# Patient Record
Sex: Male | Born: 1990 | Race: White | Hispanic: No | Marital: Single | Smoking: Never smoker
Health system: Southern US, Community
[De-identification: ages and names within clinical notes are randomized; demographics above are authoritative.]

## PROBLEM LIST (undated history)

## (undated) DIAGNOSIS — J45909 Unspecified asthma, uncomplicated: Secondary | ICD-10-CM

## (undated) DIAGNOSIS — R000 Tachycardia, unspecified: Secondary | ICD-10-CM

## (undated) DIAGNOSIS — R079 Chest pain, unspecified: Secondary | ICD-10-CM

## (undated) DIAGNOSIS — G90A Postural orthostatic tachycardia syndrome (POTS): Secondary | ICD-10-CM

## (undated) DIAGNOSIS — I951 Orthostatic hypotension: Secondary | ICD-10-CM

## (undated) DIAGNOSIS — R55 Syncope and collapse: Secondary | ICD-10-CM

## (undated) DIAGNOSIS — R002 Palpitations: Secondary | ICD-10-CM

## (undated) DIAGNOSIS — K219 Gastro-esophageal reflux disease without esophagitis: Secondary | ICD-10-CM

## (undated) DIAGNOSIS — I498 Other specified cardiac arrhythmias: Secondary | ICD-10-CM

## (undated) HISTORY — DX: Postural orthostatic tachycardia syndrome (POTS): G90.A

## (undated) HISTORY — DX: Orthostatic hypotension: I95.1

## (undated) HISTORY — DX: Palpitations: R00.2

## (undated) HISTORY — DX: Chest pain, unspecified: R07.9

## (undated) HISTORY — DX: Syncope and collapse: R55

## (undated) HISTORY — DX: Other specified cardiac arrhythmias: I49.8

## (undated) HISTORY — DX: Unspecified asthma, uncomplicated: J45.909

## (undated) HISTORY — DX: Tachycardia, unspecified: R00.0

---

## 2011-03-26 HISTORY — PX: OTHER SURGICAL HISTORY: SHX169

## 2012-09-29 DIAGNOSIS — R079 Chest pain, unspecified: Secondary | ICD-10-CM

## 2012-09-29 HISTORY — DX: Chest pain, unspecified: R07.9

## 2013-01-19 ENCOUNTER — Encounter: Payer: Self-pay | Admitting: Cardiovascular Disease

## 2013-01-19 ENCOUNTER — Encounter: Payer: Self-pay | Admitting: *Deleted

## 2013-01-19 DIAGNOSIS — J45909 Unspecified asthma, uncomplicated: Secondary | ICD-10-CM | POA: Insufficient documentation

## 2013-01-19 DIAGNOSIS — R002 Palpitations: Secondary | ICD-10-CM | POA: Insufficient documentation

## 2013-01-19 DIAGNOSIS — R079 Chest pain, unspecified: Secondary | ICD-10-CM | POA: Insufficient documentation

## 2013-01-19 DIAGNOSIS — R000 Tachycardia, unspecified: Secondary | ICD-10-CM | POA: Insufficient documentation

## 2013-01-20 ENCOUNTER — Ambulatory Visit (INDEPENDENT_AMBULATORY_CARE_PROVIDER_SITE_OTHER): Payer: BC Managed Care – PPO | Admitting: Cardiovascular Disease

## 2013-01-20 ENCOUNTER — Encounter: Payer: Self-pay | Admitting: *Deleted

## 2013-01-20 ENCOUNTER — Encounter: Payer: Self-pay | Admitting: Cardiovascular Disease

## 2013-01-20 VITALS — BP 118/74 | HR 44 | Ht 72.0 in | Wt 191.0 lb

## 2013-01-20 DIAGNOSIS — R000 Tachycardia, unspecified: Secondary | ICD-10-CM

## 2013-01-20 DIAGNOSIS — R079 Chest pain, unspecified: Secondary | ICD-10-CM

## 2013-01-20 NOTE — Assessment & Plan Note (Addendum)
Not clear to me that he has POTS.  He is not tachycardic and no change in BP or HR with abrubt standing.  Continue low dose beta blocker Will have him see Dr Graciela Husbands to see if any other w/u needed  Reviewed event monitor from [facility] SR/ST with no overtly high HR;s for activity level

## 2013-01-20 NOTE — Patient Instructions (Addendum)
Your physician recommends that you schedule a follow-up appointment in:  NEXT AVAILABLE  WITH DR Graciela Husbands   FOR POTS Your physician recommends that you continue on your current medications as directed. Please refer to the Current Medication list given to you today.  Your physician has requested that you have a stress echocardiogram. For further information please visit [email or web address] Please follow instruction sheet as given.

## 2013-01-20 NOTE — Assessment & Plan Note (Signed)
Atypical but ongoing for months.  F/U stress echo

## 2013-01-20 NOTE — Progress Notes (Signed)
Patient ID: Aldous Housel, male   DOB: 1990/12/16, 22 y.o.   MRN: [id number] 22 yo Thought he was seeing EP doctor today.  Long standing history of ? POTS.  Seen by pediatric cardiologist as kid age 79. Has mild dyspnea. Palpitations and atypical chest pain Reviewed notes from Dr [facility]  [place].  Changed atenolol to bisoprolol  Patient had some fatigue and reduced to 2.5 mg.  Chest pain is fleeting all day Non exertional.  No associated diaphoresis. Syncope No positional or pleuritic component Denies panic attacks.  Does not complain of dizzyness at this time.  No postural lightheadedness.  Denies drugs or excess ETOH.  Has not had stress test despite multiple visits to cardiologist with chest pain.  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  All other systems reviewed and negative   General: Affect appropriate Healthy:  appears stated age HEENT: normal Neck supple with no adenopathy JVP normal no bruits no thyromegaly Lungs clear with no wheezing and good diaphragmatic motion Heart:  S1/S2 no murmur,rub, gallop or click PMI normal Abdomen: benighn, BS positve, no tenderness, no AAA no bruit.  No HSM or HJR Distal pulses intact with no bruits No edema Neuro non-focal Skin warm and dry No muscular weakness  Medications Current Outpatient Prescriptions  Medication Sig Dispense Refill  . bisoprolol (ZEBETA) 5 MG tablet Take 5 mg by mouth daily. Take half of tab       No current facility-administered medications for this visit.    Allergies Neosporin  Family History: No family history on file.  Social History: History   Social History  . Marital Status: Married    Spouse Name: N/A    Number of Children: N/A  . Years  of Education: N/A   Occupational History  . Not on file.   Social History Main Topics  . Smoking status: Never Smoker   . Smokeless tobacco: Not on file  . Alcohol Use: Yes     Comment: occasional  . Drug Use: No  . Sexual Activity: Not on file   Other Topics Concern  . Not on file   Social History Narrative  . No narrative on file    Electrocardiogram: July 8 SR rate 61 nornmal   Assessment and Plan

## 2013-02-08 ENCOUNTER — Other Ambulatory Visit: Payer: Self-pay | Admitting: *Deleted

## 2013-02-08 DIAGNOSIS — R079 Chest pain, unspecified: Secondary | ICD-10-CM

## 2013-02-16 ENCOUNTER — Other Ambulatory Visit (HOSPITAL_COMMUNITY): Payer: BC Managed Care – PPO

## 2013-03-08 ENCOUNTER — Encounter: Payer: BC Managed Care – PPO | Admitting: Cardiovascular Disease

## 2013-03-08 DIAGNOSIS — R079 Chest pain, unspecified: Secondary | ICD-10-CM

## 2013-03-09 ENCOUNTER — Ambulatory Visit (INDEPENDENT_AMBULATORY_CARE_PROVIDER_SITE_OTHER): Payer: BC Managed Care – PPO | Admitting: Internal Medicine

## 2013-03-09 ENCOUNTER — Institutional Professional Consult (permissible substitution): Payer: BC Managed Care – PPO | Admitting: Internal Medicine

## 2013-03-09 ENCOUNTER — Encounter: Payer: Self-pay | Admitting: Internal Medicine

## 2013-03-09 ENCOUNTER — Encounter: Payer: Self-pay | Admitting: Cardiology

## 2013-03-09 ENCOUNTER — Ambulatory Visit (HOSPITAL_COMMUNITY): Payer: BC Managed Care – PPO | Attending: Cardiology | Admitting: Radiology

## 2013-03-09 VITALS — BP 138/84 | HR 75 | Ht 72.0 in | Wt 198.0 lb

## 2013-03-09 DIAGNOSIS — R072 Precordial pain: Secondary | ICD-10-CM

## 2013-03-09 DIAGNOSIS — R0609 Other forms of dyspnea: Secondary | ICD-10-CM | POA: Insufficient documentation

## 2013-03-09 DIAGNOSIS — R079 Chest pain, unspecified: Secondary | ICD-10-CM

## 2013-03-09 DIAGNOSIS — R0989 Other specified symptoms and signs involving the circulatory and respiratory systems: Secondary | ICD-10-CM | POA: Insufficient documentation

## 2013-03-09 DIAGNOSIS — R000 Tachycardia, unspecified: Secondary | ICD-10-CM

## 2013-03-09 NOTE — Progress Notes (Signed)
Echocardiogram performed.  

## 2013-03-09 NOTE — Assessment & Plan Note (Signed)
He has atypical chest pain and most certianly not cardiac related  i suspect this is Gi and anxiety  The evaluation evaluation is not demonstrating any specific pathology. Indeed, his vital signs and his history are also not consistent with a diagnosis of POTS although there may be some lack of sensitivity given his ongoing since therapy with bisoprolol. I suggested that he stop his bisoprolol and then may have recurrent discomfort to sequentially expose himself to the bisoprolol and omeprazole to see which one of the peppers; I suspect it will be the latter

## 2013-03-09 NOTE — Patient Instructions (Signed)
We will see you as needed  Your physician recommends that you continue on your current medications as directed. Please refer to the Current Medication list given to you today.  

## 2013-03-09 NOTE — Progress Notes (Signed)
ELECTROPHYSIOLOGY CONSULT NOTE  Patient ID: Jeffrey Lucas, MRN: [id number], DOB/AGE: [birth year] 22 y.o. Admit date: (Not on file) Date of Consult: 03/09/2013  Primary Physician: No primary provider on file. Primary Cardiologist:      Chief Complaint: ?POTS   HPI Jeffrey Lucas is a 22 y.o. male  There is a diagnosis of POTS with symptoms of chest pain and exercise intolerance he was then seen in various cardiology practices from the state.  Review of medical records are available suggested there was a normal echocardiogram and event recorder this is a rapid rates associated with non-exertion consistent with sinus tachycardia  Diagnosis may have POTS when he was 22 years old. He was treated with atenolol for some period of time. his symptoms gradually abated and he was then symptom free until the spring of this year except for one brief period in high school. At that point and again this spring he developed chest discomfort. He described it as pins and pressure in his chest. It lasted for 2 weeks  Plus and was 24/7 and was worse with recumbency and improved with sitting up. At that point he was started on atenolol which was stopped because of depressive symptoms and fatigue. He was put on the bisoprolol. Concurrent with that he was started on omeprazole  And there was large improvement       Past Medical History  Diagnosis Date  . Chest pain 09/29/12    Much of his exertional symptoms & chest pressure episodes are very common with the autonomic dysfunction syndromes. Holter (09/29/12-10/28/12) revealed some spells of elevated atrial rate w/o physical activity that would be suggestive additionally.   . Tachycardia   . Palpitations   . Asthma   . Postural orthostatic tachycardia syndrome     "Possible"  . Vasovagal episode     When giving blood  . Mild asthma       Surgical History: No past surgical history on file.   Home Meds: Prior to Admission medications   Medication Sig  Start Date End Date Taking? Authorizing Provider  bisoprolol (ZEBETA) 5 MG tablet Take 5 mg by mouth daily. Take half of tab   Yes Historical Provider, MD      Allergies:  Allergies  Allergen Reactions  . Neosporin [Neomycin-Bacitracin Zn-Polymyx]     History   Social History  . Marital Status: Single    Spouse Name: N/A    Number of Children: N/A  . Years of Education: N/A   Occupational History  . Not on file.   Social History Main Topics  . Smoking status: Never Smoker   . Smokeless tobacco: Not on file  . Alcohol Use: Yes     Comment: occasional  . Drug Use: No  . Sexual Activity: Not on file   Other Topics Concern  . Not on file   Social History Narrative  . No narrative on file     No family history on file.   ROS:  Please see the history of present illness.   Anxiety   All other systems reviewed and negative.    Physical Exam:  Blood pressure 138/84, pulse 75, height 6' (1.829 m), weight 198 lb (89.812 kg). General: Well developed, well nourished male in no acute distress. Head: Normocephalic, atraumatic, sclera non-icteric, no xanthomas, nares are without discharge. EENT: normal Lymph Nodes:  none Back: without scoliosis/kyphosis  , no CVA tendersness Neck: Negative for carotid bruits. JVD not elevated. Lungs: Clear bilaterally to auscultation without  wheezes, rales, or rhonchi. Breathing is unlabored. Heart: RRR with S1 S2. No  systolic murmur , rubs, or gallops appreciated. Abdomen: Soft, non-tender, non-distended with normoactive bowel sounds. No hepatomegaly. No rebound/guarding. No obvious abdominal masses. Msk:  Strength and tone appear normal for age. Extremities: No clubbing or cyanosis. No  edema.  Distal pedal pulses are 2+ and equal bilaterally. Skin: Warm and Dry Neuro: Alert and oriented X 3. CN III-XII intact Grossly normal sensory and motor function . Psych:  Responds to questions appropriately with a normal affect.       Labs: Cardiac Enzymes No results found for this basename: CKTOTAL, CKMB, TROPONINI,  in the last 72 hours CBC No results found for this basename: WBC, HGB, HCT, MCV, PLT   PROTIME: No results found for this basename: LABPROT, INR,  in the last 72 hours Chemistry No results found for this basename: NA, K, CL, CO2, BUN, CREATININE, CALCIUM, LABALBU, PROT, BILITOT, ALKPHOS, ALT, AST, GLUCOSE,  in the last 168 hours Lipids No results found for this basename: CHOL, HDL, LDLCALC, TRIG   BNP No results found for this basename: probnp   Miscellaneous No results found for this basename: DDIMER    Radiology/Studies:  No results found.  EKG: *SNR  Assessment and Plan:    Sherryl Manges

## 2016-03-28 ENCOUNTER — Ambulatory Visit (INDEPENDENT_AMBULATORY_CARE_PROVIDER_SITE_OTHER): Payer: Self-pay | Admitting: Nurse Practitioner

## 2016-03-28 ENCOUNTER — Encounter: Payer: Self-pay | Admitting: Nurse Practitioner

## 2016-03-28 VITALS — BP 138/72 | HR 92 | Temp 99.2°F | Resp 20 | Ht 72.0 in | Wt 184.0 lb

## 2016-03-28 DIAGNOSIS — K2960 Other gastritis without bleeding: Secondary | ICD-10-CM

## 2016-03-28 MED ORDER — OMEPRAZOLE 20 MG PO CPDR: 20.0000 mg | DELAYED_RELEASE_CAPSULE | Freq: Every day | ORAL | 2 refills | Status: AC

## 2016-03-28 NOTE — Patient Instructions (Addendum)
Gastroesophageal Reflux Disease, Adult Normally, food travels down the esophagus and stays in the stomach to be digested. However, when a person has gastroesophageal reflux disease (GERD), food and stomach acid move back up into the esophagus. When this happens, the esophagus becomes sore and inflamed. Over time, GERD can create small holes (ulcers) in the lining of the esophagus. What are the causes? This condition is caused by a problem with the muscle between the esophagus and the stomach (lower esophageal sphincter, or LES). Normally, the LES muscle closes after food passes through the esophagus to the stomach. When the LES is weakened or abnormal, it does not close properly, and that allows food and stomach acid to go back up into the esophagus. The LES can be weakened by certain dietary substances, medicines, and medical conditions, including:  Tobacco use.  Pregnancy.  Having a hiatal hernia.  Heavy alcohol use.  Certain foods and beverages, such as coffee, chocolate, onions, and peppermint.  What increases the risk? This condition is more likely to develop in:  People who have an increased body weight.  People who have connective tissue disorders.  People who use NSAID medicines.  What are the signs or symptoms? Symptoms of this condition include:  Heartburn.  Difficult or painful swallowing.  The feeling of having a lump in the throat.  Abitter taste in the mouth.  Bad breath.  Having a large amount of saliva.  Having an upset or bloated stomach.  Belching.  Chest pain.  Shortness of breath or wheezing.  Ongoing (chronic) cough or a night-time cough.  Wearing away of tooth enamel.  Weight loss.  Different conditions can cause chest pain. Make sure to see your health care provider if you experience chest pain. How is this diagnosed? Your health care provider will take a medical history and perform a physical exam. To determine if you have mild or severe  GERD, your health care provider may also monitor how you respond to treatment. You may also have other tests, including:  An endoscopy toexamine your stomach and esophagus with a small camera.  A test thatmeasures the acidity level in your esophagus.  A test thatmeasures how much pressure is on your esophagus.  A barium swallow or modified barium swallow to show the shape, size, and functioning of your esophagus.  How is this treated? The goal of treatment is to help relieve your symptoms and to prevent complications. Treatment for this condition may vary depending on how severe your symptoms are. Your health care provider may recommend:  Changes to your diet.  Medicine.  Surgery.  Follow these instructions at home: Diet  Follow a diet as recommended by your health care provider. This may involve avoiding foods and drinks such as: ? Coffee and tea (with or without caffeine). ? Drinks that containalcohol. ? Energy drinks and sports drinks. ? Carbonated drinks or sodas. ? Chocolate and cocoa. ? Peppermint and mint flavorings. ? Garlic and onions. ? Horseradish. ? Spicy and acidic foods, including peppers, chili powder, curry powder, vinegar, hot sauces, and barbecue sauce. ? Citrus fruit juices and citrus fruits, such as oranges, lemons, and limes. ? Tomato-based foods, such as red sauce, chili, salsa, and pizza with red sauce. ? Fried and fatty foods, such as donuts, french fries, potato chips, and high-fat dressings. ? High-fat meats, such as hot dogs and fatty cuts of red and white meats, such as rib eye steak, sausage, ham, and bacon. ? High-fat dairy items, such as whole milk,   butter, and cream cheese.  Eat small, frequent meals instead of large meals.  Avoid drinking large amounts of liquid with your meals.  Avoid eating meals during the 2-3 hours before bedtime.  Avoid lying down right after you eat.  Do not exercise right after you eat. General  instructions  Pay attention to any changes in your symptoms.  Take over-the-counter and prescription medicines only as told by your health care provider. Do not take aspirin, ibuprofen, or other NSAIDs unless your health care provider told you to do so.  Do not use any tobacco products, including cigarettes, chewing tobacco, and e-cigarettes. If you need help quitting, ask your health care provider.  Wear loose-fitting clothing. Do not wear anything tight around your waist that causes pressure on your abdomen.  Raise (elevate) the head of your bed 6 inches (15cm).  Try to reduce your stress, such as with yoga or meditation. If you need help reducing stress, ask your health care provider.  If you are overweight, reduce your weight to an amount that is healthy for you. Ask your health care provider for guidance about a safe weight loss goal.  Keep all follow-up visits as told by your health care provider. This is important. Contact a health care provider if:  You have new symptoms.  You have unexplained weight loss.  You have difficulty swallowing, or it hurts to swallow.  You have wheezing or a persistent cough.  Your symptoms do not improve with treatment.  You have a hoarse voice. Get help right away if:  You have pain in your arms, neck, jaw, teeth, or back.  You feel sweaty, dizzy, or light-headed.  You have chest pain or shortness of breath.  You vomit and your vomit looks like blood or coffee grounds.  You faint.  Your stool is bloody or black.  You cannot swallow, drink, or eat. This information is not intended to replace advice given to you by your health care provider. Make sure you discuss any questions you have with your health care provider. Document Released: 12/19/2004 Document Revised: 08/09/2015 Document Reviewed: 07/06/2014 Elsevier Interactive Patient Education  2017 [employer]  

## 2016-03-28 NOTE — Progress Notes (Signed)
   Subjective:    Patient ID: Jeffrey Lucas, male    DOB: 1991-02-17, 26 y.o.   MRN: [id number]  The patient is a 26 y.o. Male that presents with intermittent vomiting x 2 months but worse over the past week.  The patient states he has a wave of nausea in the morning and then vomits one episode in the mornings.  The patient states it looks like "spit" and is bile colored.  The patient states he does have a feeling of "stomach tightening" before the episode happens.  The patient denies changes in his diet, but admits to eating late at night right before bed.   The patient further denies fever, weakness, chills, diarrhea, constipation, change in bowel habits or bladder habits, dysuria, frequency or urgency.  C/o sore throat "feeling irritated", intermittent cough and intermittent SOB. Patient is a smoker. Patient does admit that he was diagnosed with POTs in the past and was prescribed bisoprolol but has since stopped taking it. Patient has not been taking any medications for his symptoms.  Patient states his father has a history of acid reflux.      Review of Systems  Constitutional: Negative.  Negative for activity change, appetite change and fever.  HENT: Negative.   Respiratory: Shortness of breath: intermittent.   Cardiovascular: Negative.   Gastrointestinal: Positive for abdominal pain (describes as abdominal tightness) and vomiting (episodic, occurring only in the mornings). Negative for constipation, diarrhea and nausea.  Genitourinary: Negative.   Allergic/Immunologic: Positive for environmental allergies. Negative for immunocompromised state.  Neurological: Positive for headaches.  Psychiatric/Behavioral: Negative.        Objective:   Physical Exam  Constitutional: He is oriented to person, place, and time. He appears well-developed and well-nourished. No distress.  HENT:  Head: Normocephalic and atraumatic.  Neck: Normal range of motion. Neck supple.  Cardiovascular: Normal  rate, regular rhythm and normal heart sounds.   Pulmonary/Chest: Effort normal and breath sounds normal. No respiratory distress. He has no wheezes.  Abdominal: Soft. Bowel sounds are normal. He exhibits no distension and no mass. There is tenderness (LUQ tenderness). There is no rebound and no guarding.  Neurological: He is alert and oriented to person, place, and time. He has normal reflexes. No cranial nerve deficit.  Skin: Skin is warm and dry.  Psychiatric: He has a normal mood and affect. His behavior is normal. Judgment and thought content normal.  Vitals reviewed.         Assessment & Plan:  Bile Acid Reflux.  Patient given prescription for Prilosec.  Patient to return in 1-2 days for follow up for LUQ tenderness.  Patient to use warm liquids or honey for throat discomfort.  Patient informed that smoking can worsen reflux symptoms.  Reviewed indications for patient to go to ER, fever, severe abdominal pain, increased vomiting, diarrhea or any other concerns.  Patient verbalizes understanding.

## 2016-07-09 ENCOUNTER — Ambulatory Visit: Payer: Self-pay | Admitting: Surgery

## 2016-07-22 NOTE — Patient Instructions (Addendum)
Alix Stowers  07/22/2016   Your procedure is scheduled on: 08-01-16  Report to [facility] Main  Entrance Take [facility]  elevators to 3rd floor to  Short Stay Center at 750  AM.    Call this number if you have problems the morning of surgery [phone]   Remember: ONLY 1 PERSON MAY GO WITH YOU TO SHORT STAY TO GET  READY MORNING OF YOUR SURGERY.  Do not eat food or drink liquids :After Midnight.     Take these medicines the morning of surgery with A SIP OF WATER: DEXILANT, FLONASE, CLARITIN, PROAIR INHALER IF NEEDED AND BRING INHALER, LORATADINE              You may not have any metal on your body including hair pins and              piercings  Do not wear jewelry, make-up, lotions, powders or perfumes, deodorant             Do not wear nail polish.  Do not shave  48 hours prior to surgery.              Men may shave face and neck.   Do not bring valuables to the hospital. [facility] IS NOT             RESPONSIBLE   FOR VALUABLES.  Contacts, dentures or bridgework may not be worn into surgery.  Leave suitcase in the car. After surgery it may be brought to your room.                  Please read over the following fact sheets you were given: _____________________________________________________________________             [facility] - Preparing for Surgery Before surgery, you can play an important role.  Because skin is not sterile, your skin needs to be as free of germs as possible.  You can reduce the number of germs on your skin by washing with CHG (chlorahexidine gluconate) soap before surgery.  CHG is an antiseptic cleaner which kills germs and bonds with the skin to continue killing germs even after washing. Please DO NOT use if you have an allergy to CHG or antibacterial soaps.  If your skin becomes reddened/irritated stop using the CHG and inform your nurse when you arrive at Short Stay. Do not shave (including legs and underarms) for at least 48  hours prior to the first CHG shower.  You may shave your face/neck. Please follow these instructions carefully:  1.  Shower with CHG Soap the night before surgery and the  morning of Surgery.  2.  If you choose to wash your hair, wash your hair first as usual with your  normal  shampoo.  3.  After you shampoo, rinse your hair and body thoroughly to remove the  shampoo.                           4.  Use CHG as you would any other liquid soap.  You can apply chg directly  to the skin and wash                       Gently with a scrungie or clean washcloth.  5.  Apply the CHG Soap to your body ONLY FROM THE  NECK DOWN.   Do not use on face/ open                           Wound or open sores. Avoid contact with eyes, ears mouth and genitals (private parts).                       Wash face,  Genitals (private parts) with your normal soap.             6.  Wash thoroughly, paying special attention to the area where your surgery  will be performed.  7.  Thoroughly rinse your body with warm water from the neck down.  8.  DO NOT shower/wash with your normal soap after using and rinsing off  the CHG Soap.                9.  Pat yourself dry with a clean towel.            10.  Wear clean pajamas.            11.  Place clean sheets on your bed the night of your first shower and do not  sleep with pets. Day of Surgery : Do not apply any lotions/deodorants the morning of surgery.  Please wear clean clothes to the hospital/surgery center.  FAILURE TO FOLLOW THESE INSTRUCTIONS MAY RESULT IN THE CANCELLATION OF YOUR SURGERY PATIENT SIGNATURE_________________________________  NURSE SIGNATURE__________________________________  ________________________________________________________________________

## 2016-07-23 ENCOUNTER — Encounter (HOSPITAL_COMMUNITY): Payer: Self-pay

## 2016-07-23 ENCOUNTER — Encounter (HOSPITAL_COMMUNITY)
Admission: RE | Admit: 2016-07-23 | Discharge: 2016-07-23 | Disposition: A | Discharge status: Home / Self Care | Payer: BLUE CROSS/BLUE SHIELD | Source: Ambulatory Visit | Attending: Surgery | Admitting: Surgery

## 2016-07-23 DIAGNOSIS — Z01810 Encounter for preprocedural cardiovascular examination: Secondary | ICD-10-CM | POA: Diagnosis not present

## 2016-07-23 DIAGNOSIS — K824 Cholesterolosis of gallbladder: Secondary | ICD-10-CM | POA: Diagnosis not present

## 2016-07-23 DIAGNOSIS — Z01812 Encounter for preprocedural laboratory examination: Secondary | ICD-10-CM | POA: Insufficient documentation

## 2016-07-23 DIAGNOSIS — I10 Essential (primary) hypertension: Secondary | ICD-10-CM | POA: Insufficient documentation

## 2016-07-23 DIAGNOSIS — Z01818 Encounter for other preprocedural examination: Secondary | ICD-10-CM | POA: Diagnosis present

## 2016-07-23 HISTORY — DX: Gastro-esophageal reflux disease without esophagitis: K21.9

## 2016-07-23 LAB — BASIC METABOLIC PANEL
ANION GAP: 6 (ref 5–15)
BUN: 8 mg/dL (ref 6–20)
CALCIUM: 9.5 mg/dL (ref 8.9–10.3)
CO2: 26 mmol/L (ref 22–32)
Chloride: 108 mmol/L (ref 101–111)
Creatinine, Ser: 0.78 mg/dL (ref 0.61–1.24)
GFR calc Af Amer: >60 mL/min (ref >60)
GLUCOSE: 93 mg/dL (ref 65–99)
Potassium: 4.4 mmol/L (ref 3.5–5.1)
Sodium: 140 mmol/L (ref 135–145)

## 2016-07-23 LAB — CBC
HCT: 46.1 % (ref 39.0–52.0)
HEMOGLOBIN: 15.3 g/dL (ref 13.0–17.0)
MCH: 28.2 pg (ref 26.0–34.0)
MCHC: 33.2 g/dL (ref 30.0–36.0)
MCV: 84.9 fL (ref 78.0–100.0)
Platelets: 193 10*3/uL (ref 150–400)
RBC: 5.43 MIL/uL (ref 4.22–5.81)
RDW: 12.4 % (ref 11.5–15.5)
WBC: 6.3 10*3/uL (ref 4.0–10.5)

## 2016-07-24 NOTE — Progress Notes (Signed)
Final EKG done 07/23/16-epic

## 2016-07-30 ENCOUNTER — Encounter (HOSPITAL_COMMUNITY): Payer: Self-pay | Admitting: Surgery

## 2016-07-30 DIAGNOSIS — K8010 Calculus of gallbladder with chronic cholecystitis without obstruction: Secondary | ICD-10-CM | POA: Diagnosis present

## 2016-07-30 DIAGNOSIS — K824 Cholesterolosis of gallbladder: Secondary | ICD-10-CM | POA: Diagnosis present

## 2016-07-30 NOTE — H&P (Signed)
General Surgery [facility]   Jeffrey CurlingDaniel T Lucas 07/09/2016 2:52 PM Location: [facility] Patient #: [id number] DOB: [birth year] Single / Language: [place] / Race: Refused to Report/Unreported Male   History of Present Illness Jeffrey Lucas([name] [name] MD; 07/09/2016 3:23 PM) The patient is a 26 year old male who presents for evaluation of gall stones. CHIEF COMPLAINT: Gallstones, gallbladder polyp  Patient is referred by Marva PandaKimberly Millsaps, NP, for evaluation of symptomatic cholelithiasis and gallbladder polyp. Patient had experienced intermittent episodes of nausea and vomiting associated with epigastric and right upper quadrant abdominal pain throughout 2017. These symptoms occurred once or twice a month. Beginning in January 2018 the patient experienced abdominal discomfort and nausea and vomiting nearly every morning following breakfast. Patient was seen and evaluated at urgent care and started on proton pump inhibitor and medication for nausea. He had persistent symptoms. He underwent abdominal ultrasound at [facility] on June 26, 2016. This demonstrated a 6 mm gallbladder polyp as well as mobile echogenic foci within the gallbladder consistent with gallstones. No other significant abnormality was seen on abdominal ultrasound.  Patient has a family history of gallbladder disease in his mother. He has had no prior abdominal surgery. He denies jaundice or acholic stools. He does have sweats and some chills during attacks of nausea and vomiting. Patient has no history of pancreatic or hepatobiliary disease. Current medications include Dexilant, loratadine, and Zofran.   Diagnostic Studies History Jeffrey Lucas([name], LPN; 0/86/57844/17/2018 6:962:55 PM) Colonoscopy  never  Allergies Jeffrey Lucas([name], LPN; 2/95/28414/17/2018 3:242:53 PM) Neosporin AF *DERMATOLOGICALS*  Allergies Reconciled   Medication History Jeffrey Lucas([name], LPN; 4/01/02724/17/2018 5:362:54 PM) Fluticasone Propionate (50MCG/ACT  Suspension, Nasal) Active. Dexilant (60MG  Capsule DR, Oral) Active. Loratadine (10MG  Capsule, Oral) Active. Zofran (8MG  Tablet, Oral) Active. Medications Reconciled  Social History Jeffrey Lucas([name], LPN; 6/44/03474/17/2018 4:252:55 PM) Alcohol use  Remotely quit alcohol use. Caffeine use  Carbonated beverages. Illicit drug use  Uses daily. Tobacco use  Former smoker.  Family History Jeffrey Lucas([name], LPN; 9/56/38754/17/2018 6:432:55 PM) Heart Disease  Mother. Hypertension  Mother.  Other Problems Jeffrey Lucas([name], LPN; 3/29/51884/17/2018 4:162:55 PM) Cholelithiasis  Gastroesophageal Reflux Disease  Other disease, cancer, significant illness     Review of Systems Jeffrey Lucas([name] LPN; 6/06/30164/17/2018 0:102:55 PM) General Present- Appetite Loss. Not Present- Chills, Fatigue, Fever, Night Sweats, Weight Gain and Weight Loss. Skin Not Present- Change in Wart/Mole, Dryness, Hives, Jaundice, New Lesions, Non-Healing Wounds, Rash and Ulcer. HEENT Present- Wears glasses/contact lenses. Not Present- Earache, Hearing Loss, Hoarseness, Nose Bleed, Oral Ulcers, Ringing in the Ears, Seasonal Allergies, Sinus Pain, Sore Throat, Visual Disturbances and Yellow Eyes. Respiratory Not Present- Bloody sputum, Chronic Cough, Difficulty Breathing, Snoring and Wheezing. Breast Not Present- Breast Mass, Breast Pain, Nipple Discharge and Skin Changes. Cardiovascular Not Present- Chest Pain, Difficulty Breathing Lying Down, Leg Cramps, Palpitations, Rapid Heart Rate, Shortness of Breath and Swelling of Extremities. Gastrointestinal Present- Constipation and Vomiting. Not Present- Abdominal Pain, Bloating, Bloody Stool, Change in Bowel Habits, Chronic diarrhea, Difficulty Swallowing, Excessive gas, Gets full quickly at meals, Hemorrhoids, Indigestion, Nausea and Rectal Pain. Male Genitourinary Not Present- Blood in Urine, Change in Urinary Stream, Frequency, Impotence, Nocturia, Painful Urination, Urgency and Urine Leakage. Musculoskeletal Not Present-  Back Pain, Joint Pain, Joint Stiffness, Muscle Pain, Muscle Weakness and Swelling of Extremities. Neurological Not Present- Decreased Memory, Fainting, Headaches, Numbness, Seizures, Tingling, Tremor, Trouble walking and Weakness. Psychiatric Not Present- Anxiety, Bipolar, Change in Sleep Pattern, Depression, Fearful and Frequent crying. Endocrine Not Present- Cold  Intolerance, Excessive Hunger, Hair Changes, Heat Intolerance, Hot flashes and New Diabetes. Hematology Not Present- Blood Thinners, Easy Bruising, Excessive bleeding, Gland problems, HIV and Persistent Infections.  Vitals Jeffrey Lucas Dockery LPN; 1/61/0960 4:54 PM) 07/09/2016 2:52 PM Weight: 174 lb Height: 72in Body Surface Area: 2.01 m Body Mass Index: 23.6 kg/m  Temp.: 98.42F(Oral)  Pulse: 79 (Regular)  BP: 112/60 (Sitting, Left Arm, Standard)       Physical Exam Jeffrey Heckler MD; 07/09/2016 3:23 PM) The physical exam findings are as follows: Note:Constitutional: See vital signs recorded above  General appearance: normal development, nutritional status normal, no deformities  Eyes: conjunctiva and lids normal without lesion; pupils equal and reactive; iris normal bilaterally  Ears, nose, mouth, throat: external exam without lesion or mass; hearing grossly normal; lips normal; teeth normal for age; gums without obvious disease; oral mucosa moist, tongue normal  Neck: symmetric on extension; trachea midline; no crepitance; no masses  Thyroid: normal in size, no palpable nodules, no tenderness  Chest - clear bilaterally without rhonchi, rales, or wheeze  Cor - regular rhythm with normal rate; no significant murmur  Abd - soft without distension; no surgical incisions; no sign of hernia; mild tenderness to palpation in the epigastrium and right upper quadrant without mass; no hepatosplenomegaly  Ext - non-tender without significant edema or lymphedema  Neuro - grossly intact; no tremor    Assessment &  Plan Jeffrey Heckler MD; 07/09/2016 3:26 PM) CHOLELITHIASES (K80.20) GALLBLADDER POLYP (K82.4) Current Plans Pt Education - Pamphlet Given - Laparoscopic Gallbladder Surgery: discussed with patient and provided information. Patient presents on referral for evaluation of symptomatic cholelithiasis, gastroesophageal reflux, and gallbladder polyp. Patient is provided with written literature on gallbladder disease to review at home.  After review of his ultrasound and a discussion of his clinical history, I have recommended proceeding with laparoscopic cholecystectomy with intraoperative cholangiography. We discussed alternatively seeking gastrointestinal consultation and further studies to include upper endoscopy or radiographic studies. However, these may be unnecessary if his symptoms improved dramatically with cholecystectomy. Given the patient's young age, the presence of gallstones and a gallbladder polyp at this point in time likely commit him to having cholecystectomy at some point in the future. Also cholecystectomy may significantly improve his symptoms of reflux.  Patient would like to proceed with surgery. We discussed the potential for conversion to open surgery. We discussed the hospital stay to be anticipated. We discussed his activity level and return to activity and work following the procedure. He understands and wishes to proceed in the near future.  The risks and benefits of the procedure have been discussed at length with the patient. The patient understands the proposed procedure, potential alternative treatments, and the course of recovery to be expected. All of the patient's questions have been answered at this time. The patient wishes to proceed with surgery.  Jeffrey Heckler, MD, [facility] Office: [phone]

## 2016-08-01 ENCOUNTER — Observation Stay (HOSPITAL_COMMUNITY)
Admission: RE | Admit: 2016-08-01 | Discharge: 2016-08-01 | Disposition: A | Discharge status: Home / Self Care | Payer: BLUE CROSS/BLUE SHIELD | Source: Ambulatory Visit | Attending: Surgery | Admitting: Surgery

## 2016-08-01 ENCOUNTER — Ambulatory Visit (HOSPITAL_COMMUNITY): Payer: BLUE CROSS/BLUE SHIELD | Admitting: Anesthesiology

## 2016-08-01 ENCOUNTER — Ambulatory Visit (HOSPITAL_COMMUNITY): Payer: BLUE CROSS/BLUE SHIELD

## 2016-08-01 ENCOUNTER — Encounter (HOSPITAL_COMMUNITY)
Admission: RE | Disposition: A | Discharge status: Home / Self Care | Payer: Self-pay | Source: Ambulatory Visit | Attending: Surgery

## 2016-08-01 ENCOUNTER — Encounter (HOSPITAL_COMMUNITY): Payer: Self-pay | Admitting: *Deleted

## 2016-08-01 DIAGNOSIS — K8010 Calculus of gallbladder with chronic cholecystitis without obstruction: Principal | ICD-10-CM | POA: Diagnosis present

## 2016-08-01 DIAGNOSIS — Z8249 Family history of ischemic heart disease and other diseases of the circulatory system: Secondary | ICD-10-CM | POA: Insufficient documentation

## 2016-08-01 DIAGNOSIS — Z79899 Other long term (current) drug therapy: Secondary | ICD-10-CM | POA: Insufficient documentation

## 2016-08-01 DIAGNOSIS — Z888 Allergy status to other drugs, medicaments and biological substances status: Secondary | ICD-10-CM | POA: Insufficient documentation

## 2016-08-01 DIAGNOSIS — Z881 Allergy status to other antibiotic agents status: Secondary | ICD-10-CM | POA: Insufficient documentation

## 2016-08-01 DIAGNOSIS — K5900 Constipation, unspecified: Secondary | ICD-10-CM | POA: Diagnosis not present

## 2016-08-01 DIAGNOSIS — Z8379 Family history of other diseases of the digestive system: Secondary | ICD-10-CM | POA: Diagnosis not present

## 2016-08-01 DIAGNOSIS — K219 Gastro-esophageal reflux disease without esophagitis: Secondary | ICD-10-CM | POA: Diagnosis not present

## 2016-08-01 DIAGNOSIS — Z87891 Personal history of nicotine dependence: Secondary | ICD-10-CM | POA: Diagnosis not present

## 2016-08-01 DIAGNOSIS — K824 Cholesterolosis of gallbladder: Secondary | ICD-10-CM | POA: Diagnosis present

## 2016-08-01 DIAGNOSIS — K8020 Calculus of gallbladder without cholecystitis without obstruction: Secondary | ICD-10-CM

## 2016-08-01 HISTORY — PX: CHOLECYSTECTOMY: SHX55

## 2016-08-01 SURGERY — LAPAROSCOPIC CHOLECYSTECTOMY WITH INTRAOPERATIVE CHOLANGIOGRAM
Anesthesia: General | Site: Abdomen

## 2016-08-01 MED ORDER — HYDROCODONE-ACETAMINOPHEN 5-325 MG PO TABS
1.0000 | ORAL_TABLET | ORAL | Status: DC | PRN
  Administered 2016-08-01 (×2): 1 via ORAL
  Administered 2016-08-01: 18:00:00 2 via ORAL
  Filled 2016-08-01: qty 1
  Filled 2016-08-01: qty 2
  Filled 2016-08-01: qty 1

## 2016-08-01 MED ORDER — FENTANYL CITRATE (PF) 100 MCG/2ML IJ SOLN
INTRAMUSCULAR | Status: AC
  Filled 2016-08-01: qty 2

## 2016-08-01 MED ORDER — SUGAMMADEX SODIUM 200 MG/2ML IV SOLN
INTRAVENOUS | Status: DC | PRN
  Administered 2016-08-01: 150 mg via INTRAVENOUS

## 2016-08-01 MED ORDER — ACETAMINOPHEN 650 MG RE SUPP: 650.0000 mg | Freq: Four times a day (QID) | RECTAL | Status: DC | PRN

## 2016-08-01 MED ORDER — HYDROCODONE-ACETAMINOPHEN 5-325 MG PO TABS: 1.0000 | ORAL_TABLET | ORAL | 0 refills | Status: DC | PRN

## 2016-08-01 MED ORDER — FENTANYL CITRATE (PF) 100 MCG/2ML IJ SOLN
25.0000 ug | INTRAMUSCULAR | Status: DC | PRN
  Administered 2016-08-01 (×2): 25 ug via INTRAVENOUS
  Administered 2016-08-01: 50 ug via INTRAVENOUS

## 2016-08-01 MED ORDER — KETOROLAC TROMETHAMINE 30 MG/ML IJ SOLN
INTRAMUSCULAR | Status: AC
  Filled 2016-08-01: qty 1

## 2016-08-01 MED ORDER — KETOROLAC TROMETHAMINE 30 MG/ML IJ SOLN
30.0000 mg | Freq: Once | INTRAMUSCULAR | Status: AC
  Administered 2016-08-01: 30 mg via INTRAVENOUS

## 2016-08-01 MED ORDER — ROCURONIUM BROMIDE 50 MG/5ML IV SOSY
PREFILLED_SYRINGE | INTRAVENOUS | Status: DC | PRN
  Administered 2016-08-01: 35 mg via INTRAVENOUS

## 2016-08-01 MED ORDER — PROPOFOL 10 MG/ML IV BOLUS
INTRAVENOUS | Status: AC
  Filled 2016-08-01: qty 20

## 2016-08-01 MED ORDER — ACETAMINOPHEN 325 MG PO TABS: 650.0000 mg | ORAL_TABLET | Freq: Four times a day (QID) | ORAL | Status: DC | PRN

## 2016-08-01 MED ORDER — IOPAMIDOL (ISOVUE-300) INJECTION 61%
INTRAVENOUS | Status: DC | PRN
  Administered 2016-08-01: 6 mL

## 2016-08-01 MED ORDER — ONDANSETRON HCL 4 MG/2ML IJ SOLN: 4.0000 mg | Freq: Once | INTRAMUSCULAR | Status: DC | PRN

## 2016-08-01 MED ORDER — ONDANSETRON HCL 4 MG/2ML IJ SOLN
INTRAMUSCULAR | Status: DC | PRN
  Administered 2016-08-01: 4 mg via INTRAVENOUS

## 2016-08-01 MED ORDER — KCL IN DEXTROSE-NACL 20-5-0.45 MEQ/L-%-% IV SOLN
INTRAVENOUS | Status: DC
  Administered 2016-08-01: 13:00:00 via INTRAVENOUS
  Filled 2016-08-01: qty 1000

## 2016-08-01 MED ORDER — ACETAMINOPHEN 10 MG/ML IV SOLN
1000.0000 mg | Freq: Once | INTRAVENOUS | Status: AC
  Administered 2016-08-01: 1000 mg via INTRAVENOUS

## 2016-08-01 MED ORDER — OXYCODONE HCL 5 MG PO TABS: 5.0000 mg | ORAL_TABLET | Freq: Once | ORAL | Status: DC | PRN

## 2016-08-01 MED ORDER — LACTATED RINGERS IR SOLN
Status: DC | PRN
  Administered 2016-08-01: 1000 mL

## 2016-08-01 MED ORDER — FLUTICASONE PROPIONATE 50 MCG/ACT NA SUSP
1.0000 | Freq: Every day | NASAL | Status: DC
  Filled 2016-08-01: qty 16

## 2016-08-01 MED ORDER — FENTANYL CITRATE (PF) 100 MCG/2ML IJ SOLN
INTRAMUSCULAR | Status: DC | PRN
  Administered 2016-08-01: 100 ug via INTRAVENOUS
  Administered 2016-08-01 (×3): 50 ug via INTRAVENOUS

## 2016-08-01 MED ORDER — MIDAZOLAM HCL 5 MG/5ML IJ SOLN
INTRAMUSCULAR | Status: DC | PRN
  Administered 2016-08-01: 2 mg via INTRAVENOUS

## 2016-08-01 MED ORDER — FENTANYL CITRATE (PF) 250 MCG/5ML IJ SOLN
INTRAMUSCULAR | Status: AC
  Filled 2016-08-01: qty 5

## 2016-08-01 MED ORDER — ONDANSETRON HCL 4 MG/2ML IJ SOLN
INTRAMUSCULAR | Status: AC
  Filled 2016-08-01: qty 2

## 2016-08-01 MED ORDER — LIDOCAINE 2% (20 MG/ML) 5 ML SYRINGE
INTRAMUSCULAR | Status: DC | PRN
  Administered 2016-08-01: 100 mg via INTRAVENOUS

## 2016-08-01 MED ORDER — OXYCODONE HCL 5 MG/5ML PO SOLN
5.0000 mg | Freq: Once | ORAL | Status: DC | PRN
  Filled 2016-08-01: qty 5

## 2016-08-01 MED ORDER — ONDANSETRON 4 MG PO TBDP: 4.0000 mg | ORAL_TABLET | Freq: Four times a day (QID) | ORAL | Status: DC | PRN

## 2016-08-01 MED ORDER — CEFAZOLIN SODIUM-DEXTROSE 2-4 GM/100ML-% IV SOLN
2.0000 g | INTRAVENOUS | Status: AC
  Administered 2016-08-01: 2 g via INTRAVENOUS
  Filled 2016-08-01: qty 100

## 2016-08-01 MED ORDER — CHLORHEXIDINE GLUCONATE CLOTH 2 % EX PADS: 6.0000 | MEDICATED_PAD | Freq: Once | CUTANEOUS | Status: DC

## 2016-08-01 MED ORDER — SUCCINYLCHOLINE CHLORIDE 200 MG/10ML IV SOSY
PREFILLED_SYRINGE | INTRAVENOUS | Status: DC | PRN
  Administered 2016-08-01: 120 mg via INTRAVENOUS

## 2016-08-01 MED ORDER — FENTANYL CITRATE (PF) 100 MCG/2ML IJ SOLN
INTRAMUSCULAR | Status: AC
  Administered 2016-08-01: 25 ug via INTRAVENOUS
  Filled 2016-08-01: qty 2

## 2016-08-01 MED ORDER — ACETAMINOPHEN 10 MG/ML IV SOLN
INTRAVENOUS | Status: AC
  Filled 2016-08-01: qty 100

## 2016-08-01 MED ORDER — LACTATED RINGERS IV SOLN
INTRAVENOUS | Status: DC
  Administered 2016-08-01: 08:00:00 via INTRAVENOUS
  Administered 2016-08-01: 1000 mL via INTRAVENOUS

## 2016-08-01 MED ORDER — ROCURONIUM BROMIDE 50 MG/5ML IV SOSY
PREFILLED_SYRINGE | INTRAVENOUS | Status: AC
  Filled 2016-08-01: qty 5

## 2016-08-01 MED ORDER — ONDANSETRON HCL 4 MG/2ML IJ SOLN: 4.0000 mg | Freq: Four times a day (QID) | INTRAMUSCULAR | Status: DC | PRN

## 2016-08-01 MED ORDER — SUCCINYLCHOLINE CHLORIDE 200 MG/10ML IV SOSY
PREFILLED_SYRINGE | INTRAVENOUS | Status: AC
  Filled 2016-08-01: qty 10

## 2016-08-01 MED ORDER — IOPAMIDOL (ISOVUE-300) INJECTION 61%
INTRAVENOUS | Status: AC
  Filled 2016-08-01: qty 50

## 2016-08-01 MED ORDER — HYDROMORPHONE HCL 1 MG/ML IJ SOLN: 1.0000 mg | INTRAMUSCULAR | Status: DC | PRN

## 2016-08-01 MED ORDER — ALBUTEROL SULFATE (2.5 MG/3ML) 0.083% IN NEBU: 3.0000 mL | INHALATION_SOLUTION | Freq: Four times a day (QID) | RESPIRATORY_TRACT | Status: DC | PRN

## 2016-08-01 MED ORDER — FENTANYL CITRATE (PF) 100 MCG/2ML IJ SOLN
25.0000 ug | INTRAMUSCULAR | Status: DC | PRN
  Administered 2016-08-01 (×2): 25 ug via INTRAVENOUS

## 2016-08-01 MED ORDER — DEXAMETHASONE SODIUM PHOSPHATE 10 MG/ML IJ SOLN
INTRAMUSCULAR | Status: DC | PRN
  Administered 2016-08-01: 10 mg via INTRAVENOUS

## 2016-08-01 MED ORDER — PROPOFOL 10 MG/ML IV BOLUS
INTRAVENOUS | Status: DC | PRN
  Administered 2016-08-01: 200 mg via INTRAVENOUS

## 2016-08-01 MED ORDER — SUGAMMADEX SODIUM 200 MG/2ML IV SOLN
INTRAVENOUS | Status: AC
  Filled 2016-08-01: qty 2

## 2016-08-01 MED ORDER — BUPIVACAINE HCL (PF) 0.25 % IJ SOLN
INTRAMUSCULAR | Status: AC
  Filled 2016-08-01: qty 30

## 2016-08-01 MED ORDER — 0.9 % SODIUM CHLORIDE (POUR BTL) OPTIME
TOPICAL | Status: DC | PRN
  Administered 2016-08-01: 1000 mL

## 2016-08-01 MED ORDER — DEXAMETHASONE SODIUM PHOSPHATE 10 MG/ML IJ SOLN
INTRAMUSCULAR | Status: AC
  Filled 2016-08-01: qty 1

## 2016-08-01 MED ORDER — LIDOCAINE 2% (20 MG/ML) 5 ML SYRINGE
INTRAMUSCULAR | Status: AC
  Filled 2016-08-01: qty 5

## 2016-08-01 MED ORDER — MIDAZOLAM HCL 2 MG/2ML IJ SOLN
INTRAMUSCULAR | Status: AC
  Filled 2016-08-01: qty 2

## 2016-08-01 MED ORDER — BUPIVACAINE HCL (PF) 0.25 % IJ SOLN
INTRAMUSCULAR | Status: DC | PRN
  Administered 2016-08-01: 20 mL

## 2016-08-01 MED ORDER — FENTANYL CITRATE (PF) 100 MCG/2ML IJ SOLN: 25.0000 ug | INTRAMUSCULAR | Status: DC | PRN

## 2016-08-01 SURGICAL SUPPLY — 32 items
APPLIER CLIP ROT 10 11.4 M/L (STAPLE) ×3
CABLE HIGH FREQUENCY MONO STRZ (ELECTRODE) ×3 IMPLANT
CHLORAPREP W/TINT 26ML (MISCELLANEOUS) ×3 IMPLANT
CLIP APPLIE ROT 10 11.4 M/L (STAPLE) ×1 IMPLANT
CLOSURE WOUND 1/2 X4 (GAUZE/BANDAGES/DRESSINGS) ×1
COVER MAYO STAND STRL (DRAPES) ×3 IMPLANT
COVER SURGICAL LIGHT HANDLE (MISCELLANEOUS) ×3 IMPLANT
DECANTER SPIKE VIAL GLASS SM (MISCELLANEOUS) IMPLANT
DRAPE C-ARM 42X120 X-RAY (DRAPES) ×3 IMPLANT
ELECT REM PT RETURN 15FT ADLT (MISCELLANEOUS) ×3 IMPLANT
GAUZE SPONGE 2X2 8PLY STRL LF (GAUZE/BANDAGES/DRESSINGS) ×1 IMPLANT
GAUZE SPONGE 4X4 12PLY STRL (GAUZE/BANDAGES/DRESSINGS) ×3 IMPLANT
GLOVE SURG ORTHO 8.0 STRL STRW (GLOVE) ×3 IMPLANT
GOWN STRL REUS W/TWL XL LVL3 (GOWN DISPOSABLE) ×9 IMPLANT
HEMOSTAT SURGICEL 4X8 (HEMOSTASIS) IMPLANT
KIT BASIN OR (CUSTOM PROCEDURE TRAY) ×3 IMPLANT
POUCH SPECIMEN RETRIEVAL 10MM (ENDOMECHANICALS) ×3 IMPLANT
SCISSORS LAP 5X35 DISP (ENDOMECHANICALS) ×3 IMPLANT
SET CHOLANGIOGRAPH MIX (MISCELLANEOUS) ×3 IMPLANT
SET IRRIG TUBING LAPAROSCOPIC (IRRIGATION / IRRIGATOR) ×3 IMPLANT
SLEEVE XCEL OPT CAN 5 100 (ENDOMECHANICALS) ×3 IMPLANT
SPONGE GAUZE 2X2 STER 10/PKG (GAUZE/BANDAGES/DRESSINGS) ×2
STRIP CLOSURE SKIN 1/2X4 (GAUZE/BANDAGES/DRESSINGS) ×2 IMPLANT
SUT MNCRL AB 4-0 PS2 18 (SUTURE) ×3 IMPLANT
TAPE CLOTH SURG 4X10 WHT LF (GAUZE/BANDAGES/DRESSINGS) ×3 IMPLANT
TOWEL OR 17X26 10 PK STRL BLUE (TOWEL DISPOSABLE) ×3 IMPLANT
TOWEL OR NON WOVEN STRL DISP B (DISPOSABLE) ×3 IMPLANT
TRAY LAPAROSCOPIC (CUSTOM PROCEDURE TRAY) ×3 IMPLANT
TROCAR BLADELESS OPT 5 100 (ENDOMECHANICALS) ×3 IMPLANT
TROCAR XCEL BLUNT TIP 100MML (ENDOMECHANICALS) ×3 IMPLANT
TROCAR XCEL NON-BLD 11X100MML (ENDOMECHANICALS) ×3 IMPLANT
TUBING INSUF HEATED (TUBING) IMPLANT

## 2016-08-01 NOTE — Discharge Instructions (Signed)
°  [facility] ° °LAPAROSCOPIC SURGERY:  POST-OP INSTRUCTIONS ° °Always review your discharge instruction sheet given to you by the facility where your surgery was performed. ° °A prescription for pain medication may be given to you upon discharge.  Take your pain medication as prescribed.  If narcotic pain medicine is not needed, then you may take acetaminophen (Tylenol) or ibuprofen (Advil) as needed. ° °Take your usually prescribed medications unless otherwise directed. ° °If you need a refill on your pain medication, please contact your pharmacy.  They will contact our office to request authorization. Prescriptions will not be filled after 5 P.M. or on weekends. ° °You should follow a light diet the first few days after arrival home, such as soup and crackers or toast.  Be sure to include plenty of fluids daily. ° °Most patients will experience some swelling and bruising in the area of the incisions.  Ice packs will help.  Swelling and bruising can take several days to resolve.  ° °It is common to experience some constipation after surgery.  Increasing fluid intake and taking a stool softener (such as Colace) will usually help or prevent this problem from occurring.  A mild laxative (Milk of Magnesia or Miralax) should be taken according to package instructions if there has been no bowel movement after 48 hours. ° °You will have steri-strips and a gauze dressing over your incisions.  You may remove the gauze bandage on the second day after surgery, and you may shower at that time.  Leave your steri-strips (small skin tapes) in place directly over the incision.  These strips should remain on the skin for 5-7 days and then be removed.  You may get them wet in the shower and pat them dry. ° °Any sutures or staples will be removed at the office during your follow-up visit. ° °ACTIVITIES:  You may resume regular (light) daily activities beginning the next day - such as daily self-care, walking,  climbing stairs - gradually increasing activities as tolerated.  You may have sexual intercourse when it is comfortable.  Refrain from any heavy lifting or straining until approved by your doctor. ° °You may drive when you are no longer taking prescription pain medication, you can comfortably wear a seatbelt, and you can safely maneuver your car and apply brakes. ° °You should see your doctor in the office for a follow-up appointment approximately 2-3 weeks after your surgery.  Make sure that you call for this appointment within a day or two after you arrive home to insure a convenient appointment time. ° °WHEN TO CALL YOUR DOCTOR: °1. Fever over 101.0 °2. Inability to urinate °3. Continued bleeding from incision °4. Increased pain, redness, or drainage from the incision °5. Increasing abdominal pain ° °The clinic staff is available to answer your questions during regular business hours.  Please don’t hesitate to call and ask to speak to one of the nurses for clinical concerns.  If you have a medical emergency, go to the nearest emergency room or call 911.  A surgeon from [facility] is always on call for the hospital. ° °Jaquavius Hudler M. Kaylea Mounsey, MD, FACS °[facility] °Office: [phone] °Toll Free:  [phone] °FAX [phone] ° °Website: [email or web address] °

## 2016-08-01 NOTE — Transfer of Care (Signed)
Immediate Anesthesia Transfer of Care Note  Patient: Jeffrey Lucas  Procedure(s) Performed: Procedure(s): LAPAROSCOPIC CHOLECYSTECTOMY WITH INTRAOPERATIVE CHOLANGIOGRAM (N/A)  Patient Location: PACU  Anesthesia Type:General  Level of Consciousness:  sedated, patient cooperative and responds to stimulation  Airway & Oxygen Therapy:Patient Spontanous Breathing and Patient connected to face mask oxgen  Post-op Assessment:  Report given to PACU RN and Post -op Vital signs reviewed and stable  Post vital signs:  Reviewed and stable  Last Vitals:  Vitals:   08/01/16 0755  BP: 138/85  Pulse: 95  Resp: 18  Temp: 36.8 C    Complications: No apparent anesthesia complications

## 2016-08-01 NOTE — Op Note (Signed)
Procedure Note  Pre-operative Diagnosis:  Chronic cholecystitis, cholelithiasis, gallbladder polyp  Post-operative Diagnosis:  same  Surgeon:  Velora Heckler, MD, FACS  Assistant:  none   Procedure:  Laparoscopic cholecystectomy with intra-operative cholangiography  Anesthesia:  General  Estimated Blood Loss:  minimal  Drains: none         Specimen: Gallbladder to pathology  Indications:  Patient is referred by [name], NP, for evaluation of symptomatic cholelithiasis and gallbladder polyp. Patient had experienced intermittent episodes of nausea and vomiting associated with epigastric and right upper quadrant abdominal pain throughout 2017. These symptoms occurred once or twice a month. Beginning in January 2018 the patient experienced abdominal discomfort and nausea and vomiting nearly every morning following breakfast. Patient was seen and evaluated at urgent care and started on proton pump inhibitor and medication for nausea. He had persistent symptoms. He underwent abdominal ultrasound at [facility] on June 26, 2016. This demonstrated a 6 mm gallbladder polyp as well as mobile echogenic foci within the gallbladder consistent with gallstones. No other significant abnormality was seen on abdominal ultrasound.  Procedure Details:  The patient was seen in the pre-op holding area. The risks, benefits, complications, treatment options, and expected outcomes were previously discussed with the patient. The patient agreed with the proposed plan and has signed the informed consent form.  The patient was brought to the Operating Room, identified as Jeffrey Lucas and the procedure verified as laparoscopic cholecystectomy with intraoperative cholangiography. A "time out" was completed and the above information confirmed.  The patient was placed in the supine position. Following induction of general anesthesia, the abdomen was prepped and draped in the usual aseptic  fashion.  An incision was made in the skin near the umbilicus. The midline fascia was incised and the peritoneal cavity was entered and a Hasson canula was introduced under direct vision.  The Hasson canula was secured with a 0-Vicryl pursestring suture. Pneumoperitoneum was established with carbon dioxide. Additional trocars were introduced under direct vision along the right costal margin in the midline, mid-clavicular line, and anterior axillary line.   The gallbladder was identified and the fundus grasped and retracted cephalad. Adhesions were taken down bluntly and the electrocautery was utilized as needed, taking care not to injure any adjacent structures. The infundibulum was grasped and retracted laterally, exposing the peritoneum overlying the triangle of Calot. The peritoneum was incised and structures exposed with blunt dissection. The cystic duct was clearly identified, bluntly dissected circumferentially, and clipped at the neck of the gallbladder.  An incision was made in the cystic duct and the cholangiogram catheter introduced. The catheter was secured using an ligaclip.  Real-time cholangiography was performed using C-arm fluoroscopy.  There was rapid filling of a normal caliber common bile duct.  There was reflux of contrast into the left and right hepatic ductal systems.  There was free flow distally into the duodenum without filling defect or obstruction.  The catheter was removed from the peritoneal cavity.  The cystic duct was then ligated with surgical clips and divided. The cystic artery was identified, dissected circumferentially, ligated with ligaclips, and divided.  The gallbladder was dissected away from the liver bed using the electrocautery for hemostasis. The gallbladder was completely removed from the liver and placed into an endocatch bag. The gallbladder was removed in the endocatch bag through the umbilical port site and submitted to pathology for review.  The right  upper quadrant was irrigated and the gallbladder bed was inspected. Hemostasis was achieved with  the electrocautery.  Pneumoperitoneum was released after viewing removal of the trocars with good hemostasis noted. The umbilical wound was irrigated and the fascia was then closed with the pursestring suture.  Local anesthetic was infiltrated at all port sites. The skin incisions were closed with 4-0 Monocril subcuticular sutures and steri-strips and dressings were applied.  Instrument, sponge, and needle counts were correct at the conclusion of the case.  The patient was awakened from anesthesia and brought to the recovery room in stable condition.  The patient tolerated the procedure well.   Velora Hecklerodd M. Kenith Trickel, MD, [facility] Office: [phone]

## 2016-08-01 NOTE — Discharge Summary (Signed)
Physician Discharge Summary [facility]  Patient ID: Jeffrey Lucas MRN: [id number] DOB/AGE: 1990/08/22 25 y.o.  Admit date: 08/01/2016 Discharge date: 08/01/2016  Admission Diagnoses:  Chronic cholecystitis, cholelithiasis, gallbladder polyp  Discharge Diagnoses:  Principal Problem:   Cholelithiasis with chronic cholecystitis Active Problems:   Gallbladder polyp   Discharged Condition: good  Hospital Course: Patient was admitted for observation following gallbladder surgery.  Post op course was uncomplicated.  Pain was well controlled.  Tolerated diet.  Voiding.  Patient was prepared for discharge home on POD#0.  Consults: None  Treatments: surgery: lap chole with IOC  Discharge Exam: Blood pressure (!) 131/92, pulse 90, temperature 97.9 F (36.6 C), temperature source Oral, resp. rate 14, height 6' (1.829 m), weight 75.6 kg (166 lb 12 oz), SpO2 100 %. See nursing notes and anesthesia notes.  Disposition: Home  Discharge Instructions    Diet - low sodium heart healthy    Complete by:  As directed    Discharge instructions    Complete by:  As directed    [facility]  LAPAROSCOPIC SURGERY:  POST-OP INSTRUCTIONS  Always review your discharge instruction sheet given to you by the facility where your surgery was performed.  A prescription for pain medication may be given to you upon discharge.  Take your pain medication as prescribed.  If narcotic pain medicine is not needed, then you may take acetaminophen (Tylenol) or ibuprofen (Advil) as needed.  Take your usually prescribed medications unless otherwise directed.  If you need a refill on your pain medication, please contact your pharmacy.  They will contact our office to request authorization. Prescriptions will not be filled after 5 P.M. or on weekends.  You should follow a light diet the first few days after arrival home, such as soup and crackers or toast.  Be sure to include  plenty of fluids daily.  Most patients will experience some swelling and bruising in the area of the incisions.  Ice packs will help.  Swelling and bruising can take several days to resolve.   It is common to experience some constipation after surgery.  Increasing fluid intake and taking a stool softener (such as Colace) will usually help or prevent this problem from occurring.  A mild laxative (Milk of Magnesia or Miralax) should be taken according to package instructions if there has been no bowel movement after 48 hours.  You will have steri-strips and a gauze dressing over your incisions.  You may remove the gauze bandage on the second day after surgery, and you may shower at that time.  Leave your steri-strips (small skin tapes) in place directly over the incision.  These strips should remain on the skin for 5-7 days and then be removed.  You may get them wet in the shower and pat them dry.  Any sutures or staples will be removed at the office during your follow-up visit.  ACTIVITIES:  You may resume regular (light) daily activities beginning the next day - such as daily self-care, walking, climbing stairs - gradually increasing activities as tolerated.  You may have sexual intercourse when it is comfortable.  Refrain from any heavy lifting or straining until approved by your doctor.  You may drive when you are no longer taking prescription pain medication, you can comfortably wear a seatbelt, and you can safely maneuver your car and apply brakes.  You should see your doctor in the office for a follow-up appointment approximately 2-3 weeks after your surgery.  Make sure that you call for this appointment within a day or two after you arrive home to insure a convenient appointment time.  WHEN TO CALL YOUR DOCTOR: Fever over 101.0 Inability to urinate Continued bleeding from incision Increased pain, redness, or drainage from the incision Increasing abdominal pain  The clinic staff is  available to answer your questions during regular business hours.  Please don't hesitate to call and ask to speak to one of the nurses for clinical concerns.  If you have a medical emergency, go to the nearest emergency room or call 911.  [facility] is always on call for the hospital.  Velora Hecklerodd M. Jalah Warmuth, MD, [facility] Office: [phone] Toll Free:  [phone] FAX [phone]  Website: [email or web address]   Increase activity slowly    Complete by:  As directed    Remove dressing in 24 hours    Complete by:  As directed      Allergies as of 08/01/2016      Reactions   Neosporin [neomycin-bacitracin Zn-polymyx] Other (See Comments)   blisters      Medication List    TAKE these medications   DEXILANT 60 MG capsule Generic drug:  dexlansoprazole Take 60 mg by mouth daily.   fluticasone 50 MCG/ACT nasal spray Commonly known as:  FLONASE Place 1 spray into both nostrils daily.   HYDROcodone-acetaminophen 5-325 MG tablet Commonly known as:  NORCO/VICODIN Take 1-2 tablets by mouth every 4 (four) hours as needed for moderate pain.   loratadine 10 MG tablet Commonly known as:  CLARITIN TAKE 1 TABLET BY MOUTH DAILY   omeprazole 20 MG capsule Commonly known as:  PRILOSEC Take 1 capsule (20 mg total) by mouth daily.   ondansetron 8 MG disintegrating tablet Commonly known as:  ZOFRAN-ODT TAKES 1 TABLET ONCE DAILY AS NEEDED FOR NAUSEA   PROAIR HFA 108 (90 Base) MCG/ACT inhaler Generic drug:  albuterol INHALE 2 PUFFS EVERY 6 HOURS AS NEEDED FOR SHORTNESS OF BREATH   STOOL SOFTENER 250 MG capsule Generic drug:  docusate sodium TAKE 1 TABLET BY MOUTH DAILY      Follow-up Information    Darnell LevelGerkin, Jermia Rigsby, MD. Schedule an appointment as soon as possible for a visit in 3 week(s).   Specialty:  General Surgery Why:  For wound re-check Contact information: [address] [place]  [id number] [phone]           Velora Hecklerodd M. Gustie Bobb, MD, [facility] Office: [phone]   Signed: Velora HecklerGERKIN,Analena Gama M 08/01/2016, 4:42 PM

## 2016-08-01 NOTE — Interval H&P Note (Signed)
History and Physical Interval Note:  08/01/2016 9:54 AM  Jeffrey Lucas  has presented today for surgery, with the diagnosis of cholelithiasis, gallbladder polyp.  The various methods of treatment have been discussed with the patient and family. After consideration of risks, benefits and other options for treatment, the patient has consented to    Procedure(s): LAPAROSCOPIC CHOLECYSTECTOMY WITH INTRAOPERATIVE CHOLANGIOGRAM (N/A) as a surgical intervention .    The patient's history has been reviewed, patient examined, no change in status, stable for surgery.  I have reviewed the patient's chart and labs.  Questions were answered to the patient's satisfaction.    Velora Hecklerodd M. Tasheba Henson, MD, [facility] Office: [phone]    Jakoby Melendrez MJudie Petit

## 2016-08-01 NOTE — Anesthesia Preprocedure Evaluation (Signed)

## 2016-08-01 NOTE — Anesthesia Postprocedure Evaluation (Addendum)
Anesthesia Post Note  Patient: Jeffrey Lucas  Procedure(s) Performed: Procedure(s) (LRB): LAPAROSCOPIC CHOLECYSTECTOMY WITH INTRAOPERATIVE CHOLANGIOGRAM (N/A)  Patient location during evaluation: PACU Anesthesia Type: General Level of consciousness: awake, awake and alert and oriented Pain management: pain level controlled Vital Signs Assessment: post-procedure vital signs reviewed and stable Respiratory status: spontaneous breathing, nonlabored ventilation and respiratory function stable Cardiovascular status: blood pressure returned to baseline Anesthetic complications: no       Last Vitals:  Vitals:   08/01/16 1230 08/01/16 1336  BP: 129/75 127/75  Pulse: 82 74  Resp: 14 14  Temp: 37.1 C 36.7 C    Last Pain:  Vitals:   08/01/16 1336  TempSrc: Oral  PainSc:                  Ottis Vacha COKER

## 2016-08-01 NOTE — Anesthesia Procedure Notes (Signed)
Procedure Name: Intubation Date/Time: 08/01/2016 10:17 AM Performed by: Maxwell Caul Pre-anesthesia Checklist: Patient identified, Emergency Drugs available, Suction available and Patient being monitored Patient Re-evaluated:Patient Re-evaluated prior to inductionOxygen Delivery Method: Circle system utilized Preoxygenation: Pre-oxygenation with 100% oxygen Intubation Type: IV induction Ventilation: Mask ventilation without difficulty Laryngoscope Size: Mac and 4 Grade View: Grade I Tube type: Oral Tube size: 7.5 mm Number of attempts: 1 Airway Equipment and Method: Stylet and Oral airway Placement Confirmation: ETT inserted through vocal cords under direct vision,  positive ETCO2 and breath sounds checked- equal and bilateral Secured at: 21 cm Tube secured with: Tape Dental Injury: Teeth and Oropharynx as per pre-operative assessment

## 2016-08-02 ENCOUNTER — Encounter (HOSPITAL_COMMUNITY): Payer: Self-pay | Admitting: Surgery

## 2016-11-14 NOTE — Addendum Note (Signed)
Addendum  created 11/14/16 1424 by Keyleen Cerrato, MD   Sign clinical note    

## 2017-03-12 ENCOUNTER — Encounter: Payer: Self-pay | Admitting: Nurse Practitioner

## 2017-03-31 ENCOUNTER — Encounter (INDEPENDENT_AMBULATORY_CARE_PROVIDER_SITE_OTHER): Payer: Self-pay

## 2017-03-31 ENCOUNTER — Other Ambulatory Visit (INDEPENDENT_AMBULATORY_CARE_PROVIDER_SITE_OTHER): Payer: BLUE CROSS/BLUE SHIELD

## 2017-03-31 ENCOUNTER — Encounter: Payer: Self-pay | Admitting: Nurse Practitioner

## 2017-03-31 ENCOUNTER — Ambulatory Visit: Payer: BLUE CROSS/BLUE SHIELD | Admitting: Nurse Practitioner

## 2017-03-31 VITALS — BP 100/68 | HR 80 | Ht 72.0 in | Wt 171.0 lb

## 2017-03-31 DIAGNOSIS — R140 Abdominal distension (gaseous): Secondary | ICD-10-CM | POA: Diagnosis not present

## 2017-03-31 DIAGNOSIS — R112 Nausea with vomiting, unspecified: Secondary | ICD-10-CM

## 2017-03-31 DIAGNOSIS — Z8719 Personal history of other diseases of the digestive system: Secondary | ICD-10-CM

## 2017-03-31 DIAGNOSIS — K5900 Constipation, unspecified: Secondary | ICD-10-CM

## 2017-03-31 LAB — CBC WITH DIFFERENTIAL/PLATELET
BASOS ABS: 0.1 10*3/uL (ref 0.0–0.1)
Basophils Relative: 0.9 % (ref 0.0–3.0)
Eosinophils Absolute: 0.3 10*3/uL (ref 0.0–0.7)
Eosinophils Relative: 2.7 % (ref 0.0–5.0)
HEMATOCRIT: 50 % (ref 39.0–52.0)
Hemoglobin: 16.5 g/dL (ref 13.0–17.0)
LYMPHS ABS: 2.8 10*3/uL (ref 0.7–4.0)
LYMPHS PCT: 30 % (ref 12.0–46.0)
MCHC: 33 g/dL (ref 30.0–36.0)
MCV: 86.2 fl (ref 78.0–100.0)
MONOS PCT: 8.5 % (ref 3.0–12.0)
Monocytes Absolute: 0.8 10*3/uL (ref 0.1–1.0)
NEUTROS ABS: 5.4 10*3/uL (ref 1.4–7.7)
NEUTROS PCT: 57.9 % (ref 43.0–77.0)
PLATELETS: 240 10*3/uL (ref 150.0–400.0)
RBC: 5.8 Mil/uL (ref 4.22–5.81)
RDW: 12.5 % (ref 11.5–15.5)
WBC: 9.4 10*3/uL (ref 4.0–10.5)

## 2017-03-31 NOTE — Progress Notes (Signed)
Chief Complaint:  Nausea and abdominal pain  HPI: Patient is a 27 year old male referred by Lindaann Pascal, PA for nausea and epigastric pain.  He had a cholecystectomy by [facility] in May for symptomatic cholelithiasis with nausea, vomiting, and right upper abdominal pain.  The right upper pain resolved post cholecystectomy. Nausea and vomiting significantly improved but never totally resolved, he continued to have occasional N+V. Then,  in early December nausea /vomiting became more frequent. Pre cholecystectomy nausea / vomiting was mainly in am upon waking up but now nausea more random. No recent marijuana use.  Additionally, he complains of intermittent periumbilical discomfort, bloating and constipation.  He takes daily PPI for history of GERD.The periumbilical discomfort is more of a "soreness" like been doing abdominal crunches. It isn't really relieved with defecation but then he can't be sure as bowels haven't really been evacuated adequately for a while.   In December [place] was seen at Urgent Care for his sx. Given Zofran which suppresses the vomiting. Weight stable. He describes constipation over the last 1.5 years. It has gotten worse since starting Zofran. He feels urge to defecate 3-4 times a day but passes only small balls of stool. He has associated bloating. He drinks plenty of water.   Labs at urgent care 03/11/2017: LFTs normal CBC normal.  Hemoglobin was 16.1, white count 8.6.   Lipase normal at 23 Urinalysis unremarkable    Past Medical History:  Diagnosis Date  . Asthma    exercise induced  . Chest pain 09/29/2012   Much of his exertional symptoms & chest pressure episodes are very common with the autonomic dysfunction syndromes. Holter (09/29/12-10/28/12) revealed some spells of elevated atrial rate w/o physical activity that would be suggestive additionally.   Marland Kitchen GERD (gastroesophageal reflux disease)   . Mild asthma   . Palpitations   . Postural orthostatic tachycardia  syndrome    "Possible"  . Tachycardia   . Vasovagal episode    When giving blood    Past Surgical History:  Procedure Laterality Date  . CHOLECYSTECTOMY N/A 08/01/2016   Procedure: LAPAROSCOPIC CHOLECYSTECTOMY WITH INTRAOPERATIVE CHOLANGIOGRAM;  Surgeon: Darnell Level, MD;  Location: [facility];  Service: General;  Laterality: N/A;  . piliodional cyst removed lower back  2013   Family History  Problem Relation Age of Onset  . Hiatal hernia Mother   . Heart disease Mother   . Heart block Maternal Grandmother        Stent placement  . Heart attack Paternal Grandfather    Social History   Tobacco Use  . Smoking status: Never Smoker  . Smokeless tobacco: Never Used  Substance Use Topics  . Alcohol use: Yes    Comment: occasional  . Drug use: Yes    Types: Marijuana    Comment: occ marijuana use last used 07-21-16   Current Outpatient Medications  Medication Sig Dispense Refill  . ondansetron (ZOFRAN-ODT) 8 MG disintegrating tablet TAKES 1 TABLET ONCE DAILY AS NEEDED FOR NAUSEA  0  . omeprazole (PRILOSEC) 20 MG capsule Take 1 capsule (20 mg total) by mouth daily. (Patient not taking: Reported on 07/22/2016) 30 capsule 2   No current facility-administered medications for this visit.    Allergies  Allergen Reactions  . Neosporin [Neomycin-Bacitracin Zn-Polymyx] Other (See Comments)    blisters     Review of Systems: Night sweats for one week. All systems reviewed and negative except where noted in HPI.    Physical Exam: BP 100/68   Pulse  80   Ht 6' (1.829 m)   Wt 171 lb (77.6 kg)   BMI 23.19 kg/m  Constitutional:  Thin but well-developed white male in no acute distress. Psychiatric: Normal mood and affect. Behavior is normal. EENT: Pupils normal.  Conjunctivae are normal. No scleral icterus. Neck supple.  Cardiovascular: Normal rate, regular rhythm. No edema Pulmonary/chest: Effort normal and breath sounds normal. No wheezing, rales or rhonchi. Abdominal: Soft,  nondistended. Nontender. Bowel sounds active throughout. There are no masses palpable. No hepatomegaly. Lymphadenopathy: No cervical adenopathy noted. Neurological: Alert and oriented to person place and time. Skin: Skin is warm and dry. No rashes noted.   ASSESSMENT AND PLAN:  1021.  27 year old male who is s/p lap chole in May for symptomatic cholelithiasis. Unfortuanely still having nausea / vomiting problems. Functional vrs delayed gastric emptying vrs PUD vrs other.  Also with periumbilical discomfort / mid upper abdominal discomfort /bloating which I think might be related to constipation.   -schedule abdominal ultrasound.  -Will arrange for EGD in case u/s unrevealing. The risks and benefits of EGD were discussed and the patient agrees to proceed.  -Hopefully when can find out what is causing the nausea and he can come off Zofran which is probably contributing to his constipation. In the meantime trial of Miralax once daily.    2. Night sweats x 1 week. Weight is stable, No fevers -cbc  Willette ClusterPaula Malikah Principato, NP  03/31/2017, 11:33 AM  Cc: Lindaann PascalScott Long, P.A.

## 2017-03-31 NOTE — Progress Notes (Signed)
I agree with the above note, plan 

## 2017-03-31 NOTE — Patient Instructions (Signed)
If you are age 27 or older, your body mass index should be between 23-30. Your Body mass index is 23.19 kg/m. If this is out of the aforementioned range listed, please consider follow up with your Primary Care Provider.  If you are age 27 or younger, your body mass index should be between 19-25. Your Body mass index is 23.19 kg/m. If this is out of the aformentioned range listed, please consider follow up with your Primary Care Provider.   Your physician has requested that you go to the basement for the following lab work before leaving today: CBC w/Diff  You have been scheduled for an endoscopy. Please follow written instructions given to you at your visit today. If you use inhalers (even only as needed), please bring them with you on the day of your procedure. Your physician has requested that you go to www.startemmi.com and enter the access code given to you at your visit today. This web site gives a general overview about your procedure. However, you should still follow specific instructions given to you by our office regarding your preparation for the procedure.  [facility] of [facility] on 04/04/17 at 9:30 am. Please arrive 15 minutes prior to your appointment for registration. Make certain not to have anything to eat or drink 4 hours prior to your appointment. Should you need to reschedule your appointment, please contact radiology at [phone]. This test typically takes about 30 minutes to perform.   Take Miralax 1 capful daily.  Thank you for choosing me and [facility].   Willette ClusterPaula Guenther, NP

## 2017-04-04 ENCOUNTER — Ambulatory Visit (HOSPITAL_COMMUNITY)
Admission: RE | Admit: 2017-04-04 | Discharge: 2017-04-04 | Disposition: A | Discharge status: Home / Self Care | Payer: BLUE CROSS/BLUE SHIELD | Source: Ambulatory Visit | Attending: Nurse Practitioner | Admitting: Nurse Practitioner

## 2017-04-04 DIAGNOSIS — R112 Nausea with vomiting, unspecified: Secondary | ICD-10-CM | POA: Insufficient documentation

## 2017-04-04 DIAGNOSIS — R140 Abdominal distension (gaseous): Secondary | ICD-10-CM | POA: Insufficient documentation

## 2017-04-04 DIAGNOSIS — K5900 Constipation, unspecified: Secondary | ICD-10-CM | POA: Diagnosis present

## 2017-04-04 DIAGNOSIS — Z9049 Acquired absence of other specified parts of digestive tract: Secondary | ICD-10-CM | POA: Diagnosis not present

## 2017-04-10 ENCOUNTER — Telehealth: Payer: Self-pay | Admitting: Nurse Practitioner

## 2017-04-10 NOTE — Telephone Encounter (Signed)
Discussed the results. 

## 2017-04-21 ENCOUNTER — Encounter: Payer: Self-pay | Admitting: Gastroenterology

## 2017-05-05 ENCOUNTER — Encounter: Payer: Self-pay | Admitting: Gastroenterology

## 2017-05-05 ENCOUNTER — Ambulatory Visit (AMBULATORY_SURGERY_CENTER): Payer: BLUE CROSS/BLUE SHIELD | Admitting: Gastroenterology

## 2017-05-05 ENCOUNTER — Other Ambulatory Visit: Payer: Self-pay

## 2017-05-05 VITALS — BP 110/53 | HR 68 | Temp 99.3°F | Resp 11 | Ht 72.0 in | Wt 174.0 lb

## 2017-05-05 DIAGNOSIS — R112 Nausea with vomiting, unspecified: Secondary | ICD-10-CM | POA: Diagnosis present

## 2017-05-05 MED ORDER — SODIUM CHLORIDE 0.9 % IV SOLN: 500.0000 mL | Freq: Once | INTRAVENOUS | Status: AC

## 2017-05-05 NOTE — Progress Notes (Signed)
Report given to PACU, vss 

## 2017-05-05 NOTE — Op Note (Signed)
[facility] Patient Name: Jeffrey Lucas Procedure Date: 05/05/2017 9:49 AM MRN: [id number] Endoscopist: Rachael Fee , MD Age: 27 Referring MD:  Date of Birth: May 06, 1990 Gender: Male Account #: [id number] Procedure:                Upper GI endoscopy Indications:              Abdominal bloating, Nausea with vomiting Medicines:                Monitored Anesthesia Care Procedure:                Pre-Anesthesia Assessment:                           - Prior to the procedure, a History and Physical                            was performed, and patient medications and                            allergies were reviewed. The patient's tolerance of                            previous anesthesia was also reviewed. The risks                            and benefits of the procedure and the sedation                            options and risks were discussed with the patient.                            All questions were answered, and informed consent                            was obtained. Prior Anticoagulants: The patient has                            taken no previous anticoagulant or antiplatelet                            agents. ASA Grade Assessment: II - A patient with                            mild systemic disease. After reviewing the risks                            and benefits, the patient was deemed in                            satisfactory condition to undergo the procedure.                           After obtaining informed consent, the endoscope was  passed under direct vision. Throughout the                            procedure, the patient's blood pressure, pulse, and                            oxygen saturations were monitored continuously. The                            Endoscope was introduced through the mouth, and                            advanced to the second part of duodenum. The upper                            GI endoscopy  was accomplished without difficulty.                            The patient tolerated the procedure well. Scope In: Scope Out: Findings:                 The esophagus was normal.                           The stomach was normal.                           The examined duodenum was normal. Complications:            No immediate complications. Estimated blood loss:                            None. Estimated Blood Loss:     Estimated blood loss: none. Impression:               - Normal UGI tract. Recommendation:           - Patient has a contact number available for                            emergencies. The signs and symptoms of potential                            delayed complications were discussed with the                            patient. Return to normal activities tomorrow.                            Written discharge instructions were provided to the                            patient.                           - Resume previous diet.                           -  Continue present medications.                           - Continue avoiding lactose since that seems to                            have really helped your Gi symptoms. Rachael Feeaniel P [name], MD 05/05/2017 9:58:24 AM This report has been signed electronically.

## 2017-05-05 NOTE — Patient Instructions (Signed)
Continue to avoid Lactose  YOU HAD AN ENDOSCOPIC PROCEDURE TODAY AT THE [facility]:   Refer to the procedure report that was given to you for any specific questions about what was found during the examination.  If the procedure report does not answer your questions, please call your gastroenterologist to clarify.  If you requested that your care partner not be given the details of your procedure findings, then the procedure report has been included in a sealed envelope for you to review at your convenience later.  YOU SHOULD EXPECT: Some feelings of bloating in the abdomen. Passage of more gas than usual.  Walking can help get rid of the air that was put into your GI tract during the procedure and reduce the bloating. If you had a lower endoscopy (such as a colonoscopy or flexible sigmoidoscopy) you may notice spotting of blood in your stool or on the toilet paper. If you underwent a bowel prep for your procedure, you may not have a normal bowel movement for a few days.  Please Note:  You might notice some irritation and congestion in your nose or some drainage.  This is from the oxygen used during your procedure.  There is no need for concern and it should clear up in a day or so.  SYMPTOMS TO REPORT IMMEDIATELY:    Following upper endoscopy (EGD)  Vomiting of blood or coffee ground material  New chest pain or pain under the shoulder blades  Painful or persistently difficult swallowing  New shortness of breath  Fever of 100F or higher  Black, tarry-looking stools  For urgent or emergent issues, a gastroenterologist can be reached at any hour by calling [phone].   DIET:  We do recommend a small meal at first, but then you may proceed to your regular diet.  Drink plenty of fluids but you should avoid alcoholic beverages for 24 hours.  ACTIVITY:  You should plan to take it easy for the rest of today and you should NOT DRIVE or use heavy machinery until tomorrow (because  of the sedation medicines used during the test).    FOLLOW UP: Our staff will call the number listed on your records the next business day following your procedure to check on you and address any questions or concerns that you may have regarding the information given to you following your procedure. If we do not reach you, we will leave a message.  However, if you are feeling well and you are not experiencing any problems, there is no need to return our call.  We will assume that you have returned to your regular daily activities without incident.  If any biopsies were taken you will be contacted by phone or by letter within the next 1-3 weeks.  Please call us at [phone] if you have not heard about the biopsies in 3 weeks.    SIGNATURES/CONFIDENTIALITY: You and/or your care partner have signed paperwork which will be entered into your electronic medical record.  These signatures attest to the fact that that the information above on your After Visit Summary has been reviewed and is understood.  Full responsibility of the confidentiality of this discharge information lies with you and/or your care-partner.

## 2017-05-06 ENCOUNTER — Telehealth: Payer: Self-pay | Admitting: *Deleted

## 2017-05-06 NOTE — Telephone Encounter (Signed)
  Follow up Call-  Call back number 05/05/2017  Post procedure Call Back phone  # [phone]  Permission to leave phone message Yes  Some recent data might be hidden     Patient questions:  Do you have a fever, pain , or abdominal swelling? No. Pain Score  0 *  Have you tolerated food without any problems? Yes.    Have you been able to return to your normal activities? Yes.    Do you have any questions about your discharge instructions: Diet   No. Medications  No. Follow up visit  No.  Do you have questions or concerns about your Care? No.  Actions: * If pain score is 4 or above: No action needed, pain <4.

## 2019-06-19 IMAGING — US US ABDOMEN COMPLETE
1 series · 14 of 25 positions shown · non-contrast
Comparison: None.

CLINICAL DATA: Nausea and vomiting

EXAM:
ABDOMEN ULTRASOUND COMPLETE

[Series 1: us abdomen complete · 0.25mm/px · 14 of 80 slices shown]
[im 1/80]
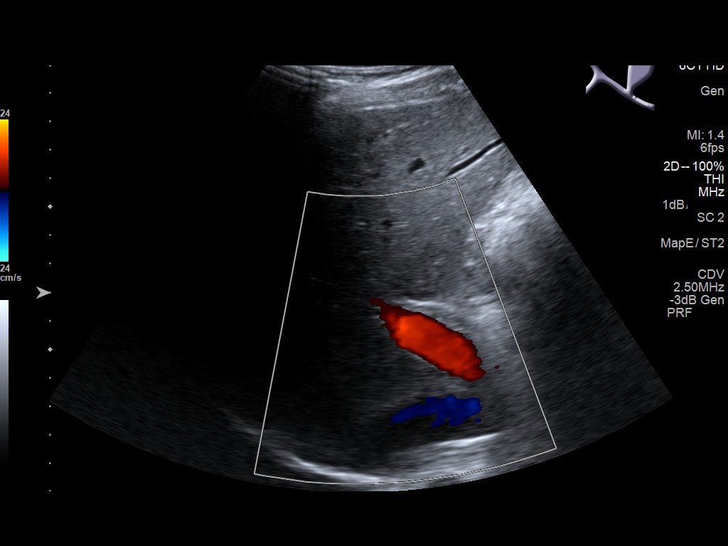
[im 7/80]
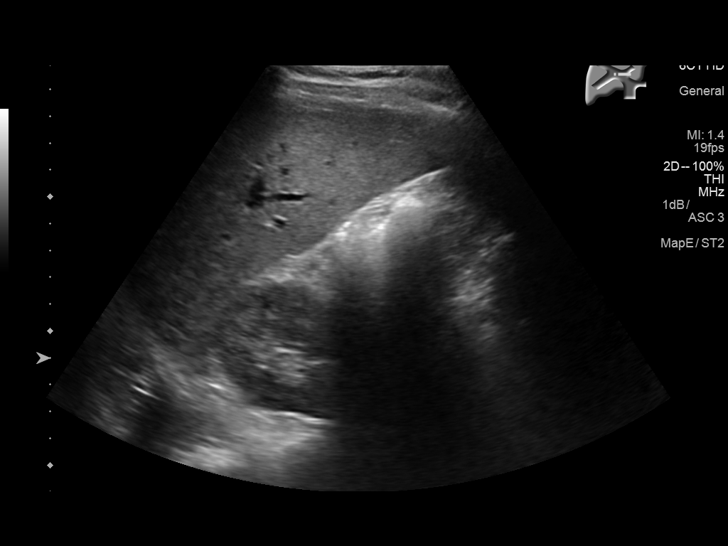
[im 14/80]
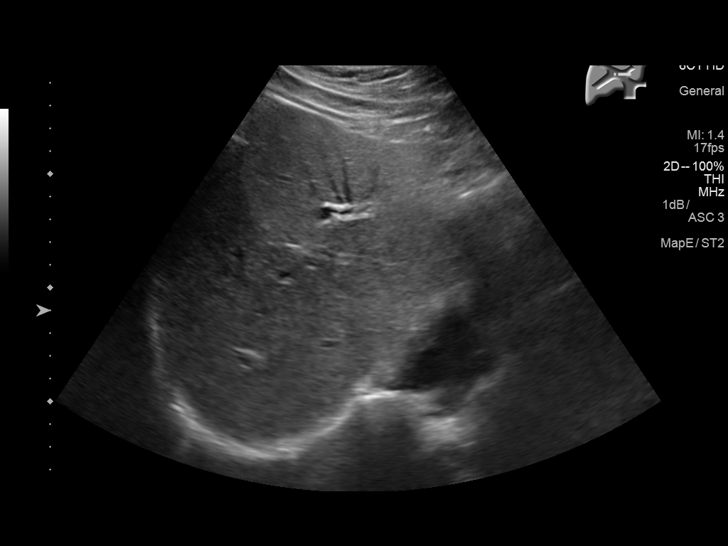
[im 20/80]
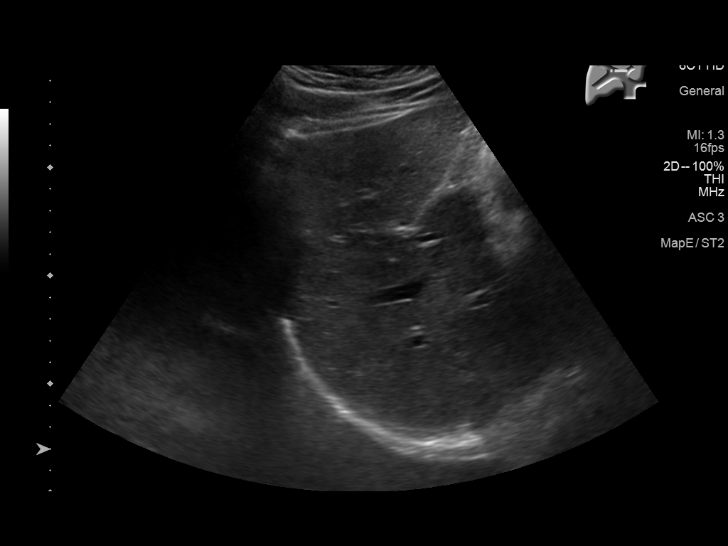
[im 27/80]
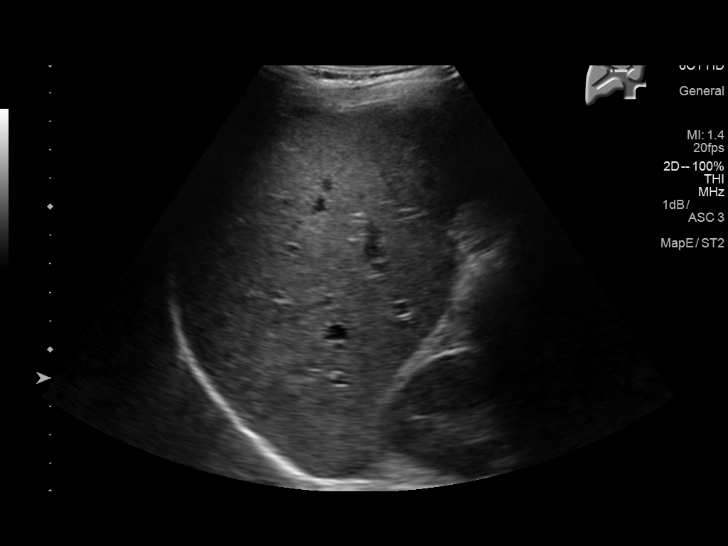
[im 30/80]
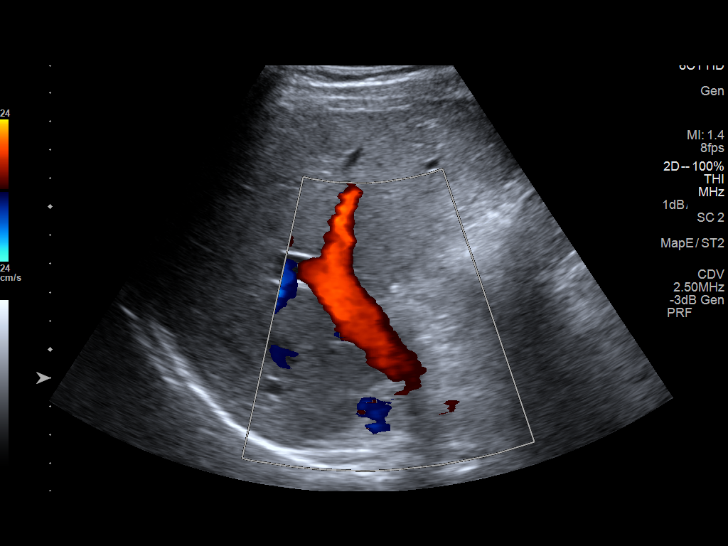
[im 37/80]
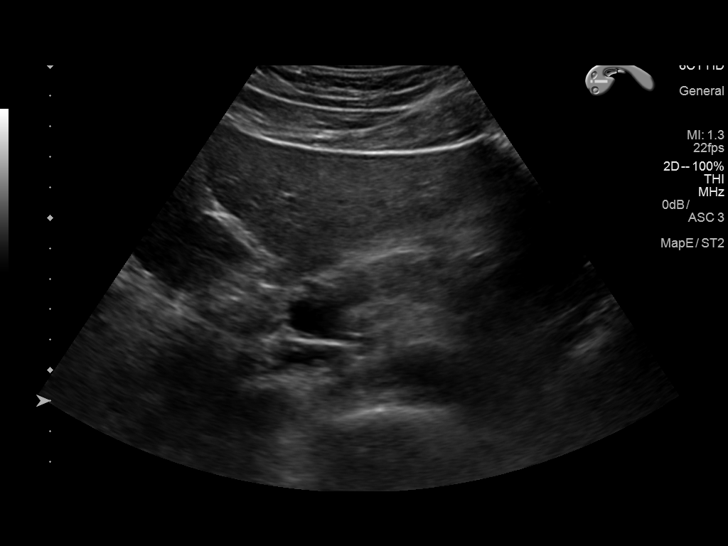
[im 43/80]
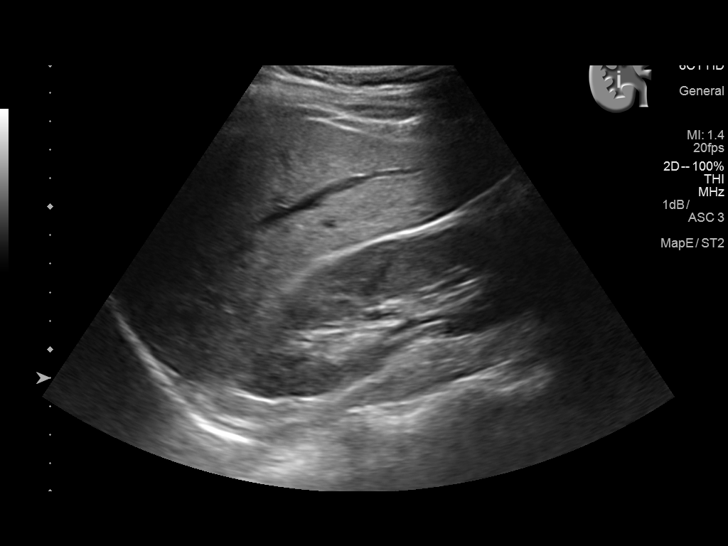
[im 50/80]
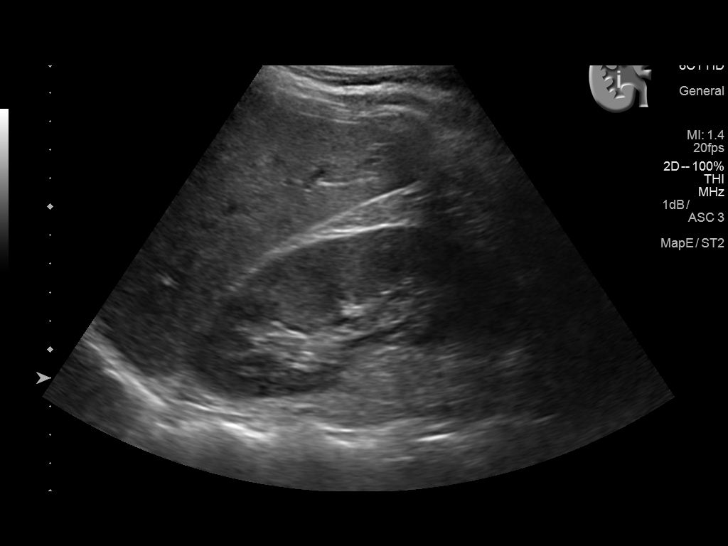
[im 53/80]
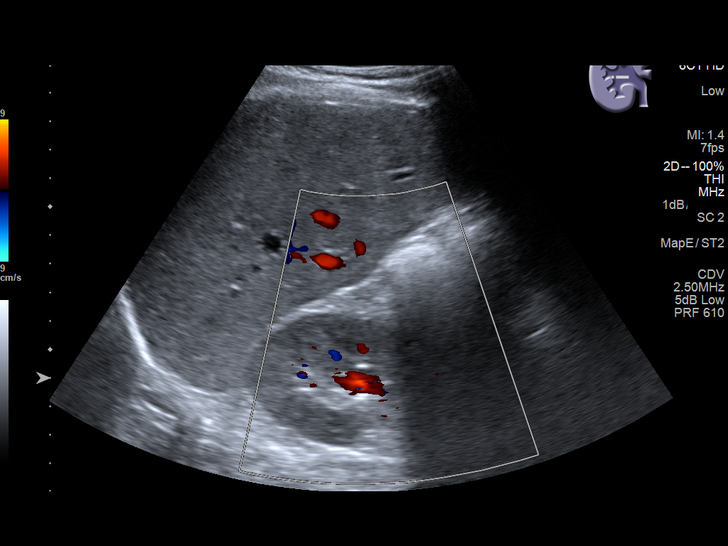
[im 60/80]
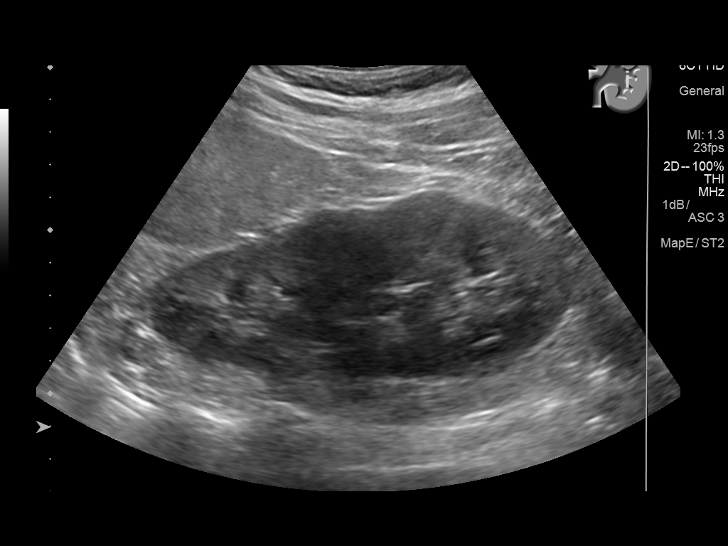
[im 66/80]
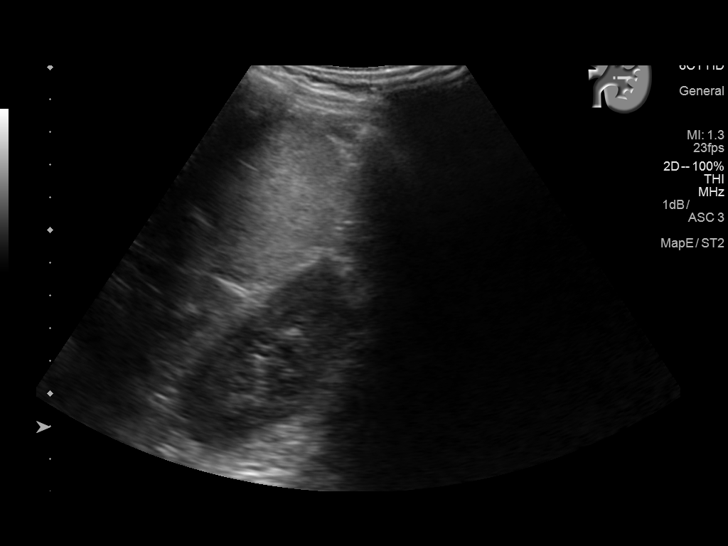
[im 73/80]
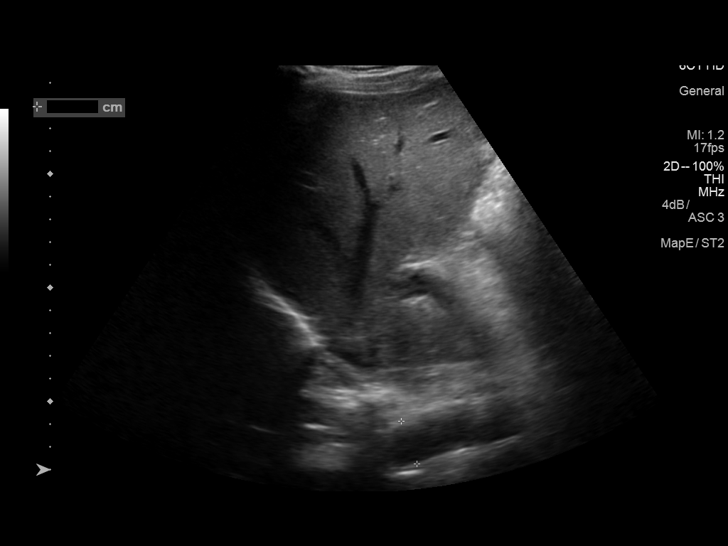
[im 80/80]
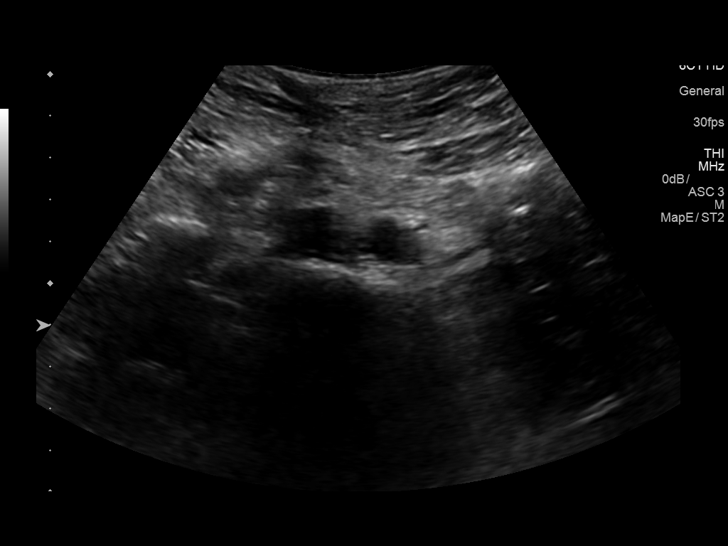

[14 of 25 positions shown; findings below may reference images not displayed]

FINDINGS: Gallbladder: Surgically removed

Common bile duct: Diameter: 3 mm

Liver: No focal lesion identified. Within normal limits in
parenchymal echogenicity. Portal vein is patent on color Doppler
imaging with normal direction of blood flow towards the liver.

IVC: No abnormality visualized.

Pancreas: Visualized portion unremarkable.

Spleen: Size and appearance within normal limits.

Right Kidney: Length: 11.6 cm.. Echogenicity within normal limits.
No mass or hydronephrosis visualized.

Left Kidney: Length: 12.9 cm.. Echogenicity within normal limits. No
mass or hydronephrosis visualized.

Abdominal aorta: No aneurysm visualized.

Other findings: None.
IMPRESSION: Status post cholecystectomy.

No acute abnormality noted.
